# Patient Record
Sex: Female | Born: 1945 | Race: White | Hispanic: No | Marital: Married | Smoking: Never smoker
Health system: Southern US, Community
[De-identification: ages and names within clinical notes are randomized; demographics above are authoritative.]

## PROBLEM LIST (undated history)

## (undated) DIAGNOSIS — M1990 Unspecified osteoarthritis, unspecified site: Secondary | ICD-10-CM

## (undated) DIAGNOSIS — K219 Gastro-esophageal reflux disease without esophagitis: Secondary | ICD-10-CM

## (undated) HISTORY — PX: ABDOMINAL HYSTERECTOMY: SHX81

## (undated) HISTORY — PX: APPENDECTOMY: SHX54

## (undated) HISTORY — PX: TONSILLECTOMY: SUR1361

---

## 2008-01-19 ENCOUNTER — Ambulatory Visit: Payer: Self-pay | Admitting: Family Medicine

## 2009-05-14 ENCOUNTER — Ambulatory Visit: Payer: Self-pay | Admitting: Nurse Practitioner

## 2010-10-05 HISTORY — PX: OTHER SURGICAL HISTORY: SHX169

## 2010-11-14 ENCOUNTER — Ambulatory Visit: Payer: Self-pay | Admitting: Unknown Physician Specialty

## 2010-11-17 LAB — PATHOLOGY REPORT

## 2011-07-09 ENCOUNTER — Ambulatory Visit: Payer: Self-pay

## 2012-08-18 ENCOUNTER — Ambulatory Visit: Payer: Self-pay | Admitting: Nurse Practitioner

## 2020-01-13 ENCOUNTER — Ambulatory Visit: Payer: Medicare Other | Attending: Internal Medicine

## 2020-01-13 DIAGNOSIS — Z23 Encounter for immunization: Secondary | ICD-10-CM

## 2020-01-13 NOTE — Progress Notes (Signed)
   Covid-19 Vaccination Clinic  Name:  FUSAKO TANABE    MRN: [id number] DOB: 1946/04/24  01/13/2020  Ms. Schroepfer was observed post Covid-19 immunization for 15 minutes without incident. She was provided with Vaccine Information Sheet and instruction to access the V-Safe system.   Ms. Hartinger was instructed to call 911 with any severe reactions post vaccine: Marland Kitchen Difficulty breathing  . Swelling of face and throat  . A fast heartbeat  . A bad rash all over body  . Dizziness and weakness   Immunizations Administered    Name Date Dose VIS Date Route   Pfizer COVID-19 Vaccine 01/13/2020  9:16 AM 0.3 mL 09/15/2019 Intramuscular   Manufacturer: [employer], [employer]   Lot: [id number]   NDC: [address]-5

## 2020-02-06 ENCOUNTER — Ambulatory Visit: Payer: Medicare Other | Attending: Internal Medicine

## 2020-02-06 DIAGNOSIS — Z23 Encounter for immunization: Secondary | ICD-10-CM

## 2020-02-06 NOTE — Progress Notes (Signed)
   Covid-19 Vaccination Clinic  Name:  Miranda Daniel    MRN: [id number] DOB: March 23, 1946  02/06/2020  Miranda Daniel was observed post Covid-19 immunization for 15 minutes without incident. She was provided with Vaccine Information Sheet and instruction to access the V-Safe system.   Miranda Daniel was instructed to call 911 with any severe reactions post vaccine: Marland Kitchen Difficulty breathing  . Swelling of face and throat  . A fast heartbeat  . A bad rash all over body  . Dizziness and weakness   Immunizations Administered    Name Date Dose VIS Date Route   Pfizer COVID-19 Vaccine 02/06/2020 11:34 AM 0.3 mL 11/29/2018 Intramuscular   Manufacturer: [employer], [employer]   Lot: [id number]   NDC: [address]-0

## 2021-01-10 DIAGNOSIS — M1612 Unilateral primary osteoarthritis, left hip: Secondary | ICD-10-CM | POA: Insufficient documentation

## 2021-01-10 DIAGNOSIS — M1611 Unilateral primary osteoarthritis, right hip: Secondary | ICD-10-CM | POA: Insufficient documentation

## 2021-02-07 ENCOUNTER — Other Ambulatory Visit: Payer: Self-pay | Admitting: Family Medicine

## 2021-02-07 DIAGNOSIS — Z1231 Encounter for screening mammogram for malignant neoplasm of breast: Secondary | ICD-10-CM

## 2021-03-12 ENCOUNTER — Other Ambulatory Visit: Payer: Medicare Other

## 2021-03-19 ENCOUNTER — Other Ambulatory Visit: Payer: Medicare Other

## 2021-03-30 NOTE — Discharge Instructions (Signed)
Instructions after Total Hip Replacement     Patterson Hollenbaugh P. Raygan Skarda, [name], M.D.     Dept. of Orthopaedics & Sports Medicine  [facility]  [address]  [place], [place]  [address]  Phone: [phone]   Fax: [phone]    DIET: . Drink plenty of non-alcoholic fluids. . Resume your normal diet. Include foods high in fiber.  ACTIVITY:  . You may use crutches or a walker with weight-bearing as tolerated, unless instructed otherwise. . You may be weaned off of the walker or crutches by your Physical Therapist.  . Do NOT reach below the level of your knees or cross your legs until allowed.    . Continue doing gentle exercises. Exercising will reduce the pain and swelling, increase motion, and prevent muscle weakness.   . Please continue to use the TED compression stockings for 6 weeks. You may remove the stockings at night, but should reapply them in the morning. . Do not drive or operate any equipment until instructed.  WOUND CARE:  . Continue to use ice packs periodically to reduce pain and swelling. . Keep the incision clean and dry. . You may bathe or shower after the staples are removed at the first office visit following surgery.  MEDICATIONS: . You may resume your regular medications. . Please take the pain medication as prescribed on the medication. . Do not take pain medication on an empty stomach. . You have been given a prescription for a blood thinner to prevent blood clots. Please take the medication as instructed. (NOTE: After completing a 2 week course of Lovenox, take one Enteric-coated aspirin once a day.) . Pain medications and iron supplements can cause constipation. Use a stool softener (Senokot or Colace) on a daily basis and a laxative (dulcolax or miralax) as needed. . Do not drive or drink alcoholic beverages when taking pain medications.  CALL THE OFFICE FOR: . Temperature above 101 degrees . Excessive bleeding or drainage on the dressing. . Excessive  swelling, coldness, or paleness of the toes. . Persistent nausea and vomiting.  FOLLOW-UP:  . You should have an appointment to return to the office in 6 weeks after surgery. . Arrangements have been made for continuation of Physical Therapy (either home therapy or outpatient therapy).     [facility] Department Directory         [email or web address]       [email or web address]          Cardiology  Appointments: [place] - [phone] [place] - [phone]  Endocrinology  Appointments: [place] - [phone] [place] - [phone]  Gastroenterology  Appointments: [place] - [phone] [place] - [phone]        General Surgery   Appointments: [place] - [phone]  Internal Medicine/Family Medicine  Appointments: [place] - [phone] [place] - [phone] [place] - [phone]  Metabolic and Weigh Loss Surgery  Appointments: [place] - [phone]        Neurology  Appointments: [place] - [phone] [place] - [phone]  Neurosurgery  Appointments: [place] - [phone]  Obstetrics & Gynecology  Appointments: [place] - [phone] [place] - [phone]        Pediatrics  Appointments: [place] - [phone] [place] - [phone]  Physiatry  Appointments: [place] -[phone]  Physical Therapy  Appointments: [place] - [phone] [place] - [phone]        Podiatry  Appointments: [place] - [phone] [place] - [phone]  Pulmonology  Appointments: [place] - [phone]  Rheumatology  Appointments:  - [phone]         Location: [facility]  [address] [place], [place]  [address]  [place] Location: [facility] [address] [place], [place]  [address]  [place] Location: [facility] [address] [place], [place]  [address]    

## 2021-04-14 ENCOUNTER — Encounter
Admission: RE | Admit: 2021-04-14 | Discharge: 2021-04-14 | Disposition: A | Discharge status: Home / Self Care | Payer: Medicare Other | Source: Ambulatory Visit | Attending: Orthopedic Surgery | Admitting: Orthopedic Surgery

## 2021-04-14 ENCOUNTER — Other Ambulatory Visit: Payer: Self-pay

## 2021-04-14 DIAGNOSIS — Z01812 Encounter for preprocedural laboratory examination: Secondary | ICD-10-CM | POA: Insufficient documentation

## 2021-04-14 HISTORY — DX: Gastro-esophageal reflux disease without esophagitis: K21.9

## 2021-04-14 LAB — URINALYSIS, ROUTINE W REFLEX MICROSCOPIC
Bilirubin Urine: NEGATIVE
Glucose, UA: NEGATIVE mg/dL
Ketones, ur: NEGATIVE mg/dL
Leukocytes,Ua: NEGATIVE
Nitrite: NEGATIVE
Protein, ur: NEGATIVE mg/dL
Specific Gravity, Urine: 1.008 (ref 1.005–1.030)
pH: 5 (ref 5.0–8.0)

## 2021-04-14 LAB — TYPE AND SCREEN
ABO/RH(D): O POS
Antibody Screen: NEGATIVE

## 2021-04-14 LAB — PROTIME-INR
INR: 0.9 (ref 0.8–1.2)
Prothrombin Time: 12.4 seconds (ref 11.4–15.2)

## 2021-04-14 LAB — SEDIMENTATION RATE: Sed Rate: 6 mm/hr (ref 0–30)

## 2021-04-14 LAB — C-REACTIVE PROTEIN: CRP: <0.5 mg/dL (ref <1.0)

## 2021-04-14 LAB — APTT: aPTT: 27 seconds (ref 24–36)

## 2021-04-14 LAB — SURGICAL PCR SCREEN
MRSA, PCR: NEGATIVE
Staphylococcus aureus: POSITIVE — AB

## 2021-04-14 NOTE — Patient Instructions (Signed)
Your procedure is scheduled on: Friday April 25, 2021. Report to Day Surgery inside [facility] 2nd floor (stop by admissions desk first before getting on elevator). To find out your arrival time please call [phone] between 1PM - 3PM on Thursday April 24, 2021.  Remember: Instructions that are not followed completely may result in serious medical risk,  up to and including death, or upon the discretion of your surgeon and anesthesiologist your  surgery may need to be rescheduled.     _X__ 1. Do not eat food after midnight the night before your procedure.                 No chewing gum or hard candies. You may drink clear liquids up to 2 hours                 before you are scheduled to arrive for your surgery- DO not drink clear                 liquids within 2 hours of the start of your surgery.                 Clear Liquids include:  water, apple juice without pulp, clear Gatorade, G2 or                  Gatorade Zero (avoid Red/Purple/Blue), Black Coffee or Tea (Do not add                 anything to coffee or tea).  __X__2.   Complete the "Ensure Clear Pre-surgery Clear Carbohydrate Drink" provided to you, 2 hours before arrival. **If you are diabetic you will be provided with an alternative drink, Gatorade Zero or G2.  __X__3.  On the morning of surgery brush your teeth with toothpaste and water, you                may rinse your mouth with mouthwash if you wish.  Do not swallow any toothpaste of mouthwash.     _X__ 4.  No Alcohol for 24 hours before or after surgery.   _X__ 5.  Do Not Smoke or use e-cigarettes For 24 Hours Prior to Your Surgery.                 Do not use any chewable tobacco products for at least 6 hours prior to                 Surgery.  _X__  6.  Do not use any recreational drugs (marijuana, cocaine, heroin, ecstasy, MDMA or other)                For at least one week prior to your surgery.  Combination of these drugs with  anesthesia                May have life threatening results.  __X__  7.  Notify your doctor if there is any change in your medical condition      (cold, fever, infections).     Do not wear jewelry, make-up, hairpins, clips or nail polish. Do not wear lotions, powders, or perfumes. You may wear deodorant. Do not shave 48 hours prior to surgery. Men may shave face and neck. Do not bring valuables to the hospital.    [facility] is not responsible for any belongings or valuables.  Contacts, dentures or bridgework may not be worn into surgery. Leave your suitcase in the car. After  surgery it may be brought to your room. For patients admitted to the hospital, discharge time is determined by your treatment team.   Patients discharged the day of surgery will not be allowed to drive home.   Make arrangements for someone to be with you for the first 24 hours of your Same Day Discharge.    Please read over the following fact sheets that you were given:   Total Joint Packet    __X__ Take these medicines the morning of surgery with A SIP OF WATER:    1. esomeprazole (NEXIUM) 20 MG   2.   3.   4.  5.  6.  ____ Fleet Enema (as directed)   __X__ Use CHG Soap (or wipes) as directed  ____ Use Benzoyl Peroxide Gel as instructed  ____ Use inhalers on the day of surgery  ____ Stop metformin 2 days prior to surgery    ____ Take 1/2 of usual insulin dose the night before surgery. No insulin the morning          of surgery.   ____ Call your PCP, cardiologist, or Pulmonologist if taking Coumadin/Plavix/aspirin and ask when to stop before your surgery.   __X__ One Week prior to surgery- Stop Anti-inflammatories such as Ibuprofen, Aleve, Advil, Motrin, meloxicam (MOBIC), diclofenac, etodolac, ketorolac, Toradol, Daypro, piroxicam, Goody's or BC powders. OK TO USE TYLENOL IF NEEDED   __X__ Stop any supplements until after surgery.    ____ Bring C-Pap to the hospital.    If you have any  questions regarding your pre-procedure instructions,  Please call Pre-admit Testing at [phone].

## 2021-04-15 LAB — URINE CULTURE
Culture: NO GROWTH
Special Requests: NORMAL

## 2021-04-20 NOTE — H&P (Signed)
ORTHOPAEDIC HISTORY & PHYSICAL Michelene Gardener, [place] - 04/17/2021 2:45 PM EDT Formatting of this note is different from the original. [facility] Chief Complaint:   Chief Complaint  Patient presents with   Hip Pain  H & P RIGHT HIP   History of Present Illness:   Miranda Daniel is a 75 y.o. female that presents to clinic today for her preoperative history and evaluation. Patient presents with her husband. The patient is scheduled to undergo a right total hip arthroplasty on 04/25/21 by Dr. Ernest Pine. Her pain began approximately 1 year ago. The pain is located in the right hip and groin. She describes her pain as worse with weightbearing. She reports associated decrease in hip range of motion and difficulty with putting on and removing shoes and socks. She denies associated numbness or tingling.   The patient's symptoms have progressed to the point that they decrease her quality of life. The patient has previously undergone conservative treatment including NSAIDS, Tylenol and activity modification without adequate control of her symptoms.  Patient denies any history of blood clots, lumbar surgery, denies significant cardiac history.  Past Medical, Surgical, Family, Social History, Allergies, Medications:   Past Medical History:  Past Medical History:  Diagnosis Date   Chicken pox   GERD (gastroesophageal reflux disease)   Osteoarthritis   Past Surgical History:  Past Surgical History:  Procedure Laterality Date   APPENDECTOMY   COLONOSCOPY 11/14/2010  Hyperplastic Polyp: CBF 11/2020   HYSTERECTOMY   TONSILLECTOMY   Current Medications:  Current Outpatient Medications  Medication Sig Dispense Refill   acetaminophen (TYLENOL) 500 MG tablet Take 1,000 mg by mouth every 6 (six) hours as needed   esomeprazole (NEXIUM) 20 MG DR capsule Take 20 mg by mouth once daily   ibuprofen (MOTRIN) 200 MG tablet Take 400 mg by mouth once daily as  needed for Pain Alternates with aleve every other day   naproxen sodium (ALEVE) 220 MG tablet Take 220 mg by mouth once daily as needed for Pain Alternates with ibuprofen every other day   No current facility-administered medications for this visit.   Allergies:  Allergies  Allergen Reactions   Amoxicillin-Pot Clavulanate Diarrhea and Other (See Comments)  Upset stomach   Social History:  Social History   Socioeconomic History   Marital status: Married  Spouse name: Ronnie   Number of children: 2   Years of education: 12   Highest education level: High school graduate  Occupational History   Occupation: Retired- IT consultant  Tobacco Use   Smoking status: Never Smoker   Smokeless tobacco: Never Used  Building services engineer Use: Never used  Substance and Sexual Activity   Alcohol use: Yes  Alcohol/week: 4.0 standard drinks  Types: 4 Glasses of wine per week   Drug use: No   Sexual activity: Yes  Partners: Male   Family History:  Family History  Problem Relation Age of Onset   Dementia Mother   No Known Problems Father   Melanoma Sister   Dementia Maternal Grandmother   Dementia Maternal Grandfather   Review of Systems:   A 10+ ROS was performed, reviewed, and the pertinent orthopaedic findings are documented in the HPI.   Physical Examination:   BP 126/82 (BP Location: Left upper arm, Patient Position: Sitting, BP Cuff Size: Adult)  Ht 170.2 cm (5\' 7" )  Wt 86.1 kg (189 lb 12.8 oz)  BMI 29.73 kg/m   Patient is a well-developed, well-nourished  female in no acute distress. Patient has normal mood and affect. Patient is alert and oriented to person, place, and time.   HEENT: Atraumatic, normocephalic. Pupils equal and reactive to light. Extraocular motion intact. Noninjected sclera.  Cardiovascular: Regular rate and rhythm, with no murmurs, rubs, or gallops. Distal pulses ausculated with Doppler.  Respiratory: Lungs clear to auscultation bilaterally.   Patient  ambulating with use of a cane.  Right Hip: Pelvic tilt: Negative Limb lengths: Equal with the patient standing Soft tissue swelling: Negative Erythema: Negative Crepitance: Negative Tenderness: Greater trochanter is mildly tender to palpation. Moderate pain is elicited by axial compression or extremes of rotation. Atrophy: No atrophy. Fair to good hip flexor and abductor strength. Range of Motion: EXT/FLEX: 0/0/90 ADD/ABD: 20/0/20 IR/ER: 5/0/15   Sensation is intact over the saphenous, lateral cutaneous, superficial fibular, and deep fibular nerve distributions.  Tests Performed/Reviewed:  X-rays  Anteroposterior views of the pelvis as well as anteroposterior and lateral views of the right hip were obtained. Images reveal complete loss of femoral acetabular joint space with bone-on-bone contact and early deformation of the femoral head noted. Significant osteophyte formation noted of both acetabulum and femoral head.  I personally ordered and interpreted today's x-rays  Impression:   ICD-10-CM  1. Primary osteoarthritis of right hip M16.11   Plan:   The patient has end-stage degenerative changes of the right hip. It was explained to the patient that the condition is progressive in nature. Having failed conservative treatment, the patient has elected to proceed with a total joint arthroplasty. The patient will undergo a total joint arthroplasty with Dr. Ernest Pine. The risks of surgery, including blood clot and infection, were discussed with the patient. Measures to reduce these risks, including the use of anticoagulation, perioperative antibiotics, and early ambulation were discussed. The importance of postoperative physical therapy was discussed with the patient. The patient elects to proceed with surgery. The patient is instructed to stop all blood thinners prior to surgery. The patient is instructed to call the hospital the day before surgery to learn of the proper arrival time.    Contact our office with any questions or concerns. Follow up as indicated, or sooner should any new problems arise, if conditions worsen, or if they are otherwise concerned.   Michelene Gardener, PA-C [facility] Orthopaedics and Sports Medicine [address] [place], [place] [address] Phone: [phone]  This note was generated in part with voice recognition software and I apologize for any typographical errors that were not detected and corrected.  Electronically signed by Michelene Gardener, PA at 04/19/2021 1:47 PM EDT

## 2021-04-23 ENCOUNTER — Other Ambulatory Visit: Payer: Self-pay

## 2021-04-23 ENCOUNTER — Other Ambulatory Visit
Admission: RE | Admit: 2021-04-23 | Discharge: 2021-04-23 | Disposition: A | Discharge status: Home / Self Care | Payer: Medicare Other | Source: Ambulatory Visit | Attending: Orthopedic Surgery | Admitting: Orthopedic Surgery

## 2021-04-23 DIAGNOSIS — Z01812 Encounter for preprocedural laboratory examination: Secondary | ICD-10-CM | POA: Insufficient documentation

## 2021-04-23 DIAGNOSIS — Z20822 Contact with and (suspected) exposure to covid-19: Secondary | ICD-10-CM | POA: Insufficient documentation

## 2021-04-23 LAB — SARS CORONAVIRUS 2 (TAT 6-24 HRS): SARS Coronavirus 2: NEGATIVE

## 2021-04-25 ENCOUNTER — Inpatient Hospital Stay: Payer: Medicare Other | Admitting: Certified Registered"

## 2021-04-25 ENCOUNTER — Other Ambulatory Visit: Payer: Self-pay

## 2021-04-25 ENCOUNTER — Encounter: Payer: Self-pay | Admitting: Orthopedic Surgery

## 2021-04-25 ENCOUNTER — Inpatient Hospital Stay
Admission: RE | Admit: 2021-04-25 | Discharge: 2021-04-28 | DRG: 470 | Disposition: A | Discharge status: Home Health | Payer: Medicare Other | Attending: Orthopedic Surgery | Admitting: Orthopedic Surgery

## 2021-04-25 ENCOUNTER — Inpatient Hospital Stay: Payer: Medicare Other

## 2021-04-25 ENCOUNTER — Encounter
Admission: RE | Disposition: A | Discharge status: Home Health | Payer: Self-pay | Source: Home / Self Care | Attending: Orthopedic Surgery

## 2021-04-25 DIAGNOSIS — Z880 Allergy status to penicillin: Secondary | ICD-10-CM

## 2021-04-25 DIAGNOSIS — K219 Gastro-esophageal reflux disease without esophagitis: Secondary | ICD-10-CM | POA: Diagnosis present

## 2021-04-25 DIAGNOSIS — M1611 Unilateral primary osteoarthritis, right hip: Principal | ICD-10-CM | POA: Diagnosis present

## 2021-04-25 DIAGNOSIS — Z96641 Presence of right artificial hip joint: Secondary | ICD-10-CM

## 2021-04-25 DIAGNOSIS — Z96649 Presence of unspecified artificial hip joint: Secondary | ICD-10-CM

## 2021-04-25 HISTORY — PX: TOTAL HIP ARTHROPLASTY: SHX124

## 2021-04-25 LAB — ABO/RH: ABO/RH(D): O POS

## 2021-04-25 SURGERY — ARTHROPLASTY, HIP, TOTAL,POSTERIOR APPROACH
Anesthesia: Spinal | Site: Hip | Laterality: Right

## 2021-04-25 MED ORDER — SURGIPHOR WOUND IRRIGATION SYSTEM - OPTIME: TOPICAL | Status: DC | PRN

## 2021-04-25 MED ORDER — DEXAMETHASONE SODIUM PHOSPHATE 10 MG/ML IJ SOLN: 8.0000 mg | Freq: Once | INTRAMUSCULAR | Status: AC

## 2021-04-25 MED ORDER — TRANEXAMIC ACID-NACL 1000-0.7 MG/100ML-% IV SOLN
1000.0000 mg | Freq: Once | INTRAVENOUS | Status: AC
  Administered 2021-04-25: 1000 mg via INTRAVENOUS

## 2021-04-25 MED ORDER — MENTHOL 3 MG MT LOZG
1.0000 | LOZENGE | OROMUCOSAL | Status: DC | PRN
  Filled 2021-04-25: qty 9

## 2021-04-25 MED ORDER — CHLORHEXIDINE GLUCONATE 4 % EX LIQD
60.0000 mL | Freq: Once | CUTANEOUS | Status: AC
  Administered 2021-04-25: 4 via TOPICAL

## 2021-04-25 MED ORDER — OXYCODONE HCL 5 MG PO TABS
5.0000 mg | ORAL_TABLET | ORAL | Status: DC | PRN
  Administered 2021-04-25 – 2021-04-28 (×8): 5 mg via ORAL
  Filled 2021-04-25 (×5): qty 1

## 2021-04-25 MED ORDER — CELECOXIB 200 MG PO CAPS: 400.0000 mg | ORAL_CAPSULE | Freq: Once | ORAL | Status: AC

## 2021-04-25 MED ORDER — LACTATED RINGERS IV SOLN: INTRAVENOUS | Status: DC

## 2021-04-25 MED ORDER — FENTANYL CITRATE (PF) 100 MCG/2ML IJ SOLN
INTRAMUSCULAR | Status: AC
  Filled 2021-04-25: qty 2

## 2021-04-25 MED ORDER — CELECOXIB 200 MG PO CAPS
200.0000 mg | ORAL_CAPSULE | Freq: Two times a day (BID) | ORAL | Status: DC
  Administered 2021-04-25 – 2021-04-28 (×6): 200 mg via ORAL
  Filled 2021-04-25 (×6): qty 1

## 2021-04-25 MED ORDER — PANTOPRAZOLE SODIUM 40 MG PO TBEC
40.0000 mg | DELAYED_RELEASE_TABLET | Freq: Two times a day (BID) | ORAL | Status: DC
  Administered 2021-04-25 – 2021-04-28 (×7): 40 mg via ORAL
  Filled 2021-04-25 (×7): qty 1

## 2021-04-25 MED ORDER — HYDROMORPHONE HCL 1 MG/ML IJ SOLN
0.5000 mg | INTRAMUSCULAR | Status: DC | PRN
  Administered 2021-04-25 – 2021-04-26 (×2): 1 mg via INTRAVENOUS
  Filled 2021-04-25 (×2): qty 1

## 2021-04-25 MED ORDER — MIDAZOLAM HCL 5 MG/5ML IJ SOLN
INTRAMUSCULAR | Status: DC | PRN
  Administered 2021-04-25: 1 mg via INTRAVENOUS

## 2021-04-25 MED ORDER — ACETAMINOPHEN 10 MG/ML IV SOLN
INTRAVENOUS | Status: DC | PRN
  Administered 2021-04-25: 1000 mg via INTRAVENOUS

## 2021-04-25 MED ORDER — CELECOXIB 200 MG PO CAPS
ORAL_CAPSULE | ORAL | Status: AC
  Administered 2021-04-25: 400 mg via ORAL
  Filled 2021-04-25: qty 2

## 2021-04-25 MED ORDER — SENNOSIDES-DOCUSATE SODIUM 8.6-50 MG PO TABS
1.0000 | ORAL_TABLET | Freq: Two times a day (BID) | ORAL | Status: DC
  Administered 2021-04-25 – 2021-04-28 (×6): 1 via ORAL
  Filled 2021-04-25 (×6): qty 1

## 2021-04-25 MED ORDER — ACETAMINOPHEN 10 MG/ML IV SOLN
INTRAVENOUS | Status: AC
  Filled 2021-04-25: qty 100

## 2021-04-25 MED ORDER — FERROUS SULFATE 325 (65 FE) MG PO TABS
325.0000 mg | ORAL_TABLET | Freq: Two times a day (BID) | ORAL | Status: DC
  Administered 2021-04-25 – 2021-04-28 (×6): 325 mg via ORAL
  Filled 2021-04-25 (×6): qty 1

## 2021-04-25 MED ORDER — PROPOFOL 500 MG/50ML IV EMUL
INTRAVENOUS | Status: DC | PRN
  Administered 2021-04-25: 100 ug/kg/min via INTRAVENOUS

## 2021-04-25 MED ORDER — ACETAMINOPHEN 10 MG/ML IV SOLN
1000.0000 mg | Freq: Four times a day (QID) | INTRAVENOUS | Status: AC
  Administered 2021-04-25 – 2021-04-26 (×4): 1000 mg via INTRAVENOUS
  Filled 2021-04-25 (×4): qty 100

## 2021-04-25 MED ORDER — NEOMYCIN-POLYMYXIN B GU 40-200000 IR SOLN
Status: DC | PRN
  Administered 2021-04-25: 14 mL

## 2021-04-25 MED ORDER — 0.9 % SODIUM CHLORIDE (POUR BTL) OPTIME
TOPICAL | Status: DC | PRN
  Administered 2021-04-25: 500 mL

## 2021-04-25 MED ORDER — PHENYLEPHRINE HCL (PRESSORS) 10 MG/ML IV SOLN
INTRAVENOUS | Status: DC | PRN
  Administered 2021-04-25 (×4): 100 ug via INTRAVENOUS

## 2021-04-25 MED ORDER — ALUM & MAG HYDROXIDE-SIMETH 200-200-20 MG/5ML PO SUSP: 30.0000 mL | ORAL | Status: DC | PRN

## 2021-04-25 MED ORDER — CEFAZOLIN SODIUM-DEXTROSE 2-4 GM/100ML-% IV SOLN
2.0000 g | INTRAVENOUS | Status: AC
  Administered 2021-04-25: 2 g via INTRAVENOUS

## 2021-04-25 MED ORDER — BISACODYL 10 MG RE SUPP: 10.0000 mg | Freq: Every day | RECTAL | Status: DC | PRN

## 2021-04-25 MED ORDER — TRANEXAMIC ACID-NACL 1000-0.7 MG/100ML-% IV SOLN
INTRAVENOUS | Status: AC
  Filled 2021-04-25: qty 100

## 2021-04-25 MED ORDER — TRAMADOL HCL 50 MG PO TABS: 50.0000 mg | ORAL_TABLET | ORAL | Status: DC | PRN

## 2021-04-25 MED ORDER — PHENOL 1.4 % MT LIQD
1.0000 | OROMUCOSAL | Status: DC | PRN
  Filled 2021-04-25: qty 177

## 2021-04-25 MED ORDER — ENOXAPARIN SODIUM 30 MG/0.3ML IJ SOSY
30.0000 mg | PREFILLED_SYRINGE | Freq: Two times a day (BID) | INTRAMUSCULAR | Status: DC
  Administered 2021-04-26 – 2021-04-28 (×5): 30 mg via SUBCUTANEOUS
  Filled 2021-04-25 (×5): qty 0.3

## 2021-04-25 MED ORDER — DIPHENHYDRAMINE HCL 12.5 MG/5ML PO ELIX: 12.5000 mg | ORAL_SOLUTION | ORAL | Status: DC | PRN

## 2021-04-25 MED ORDER — PROPOFOL 1000 MG/100ML IV EMUL
INTRAVENOUS | Status: AC
  Filled 2021-04-25: qty 100

## 2021-04-25 MED ORDER — GABAPENTIN 300 MG PO CAPS: 300.0000 mg | ORAL_CAPSULE | Freq: Once | ORAL | Status: AC

## 2021-04-25 MED ORDER — CHLORHEXIDINE GLUCONATE 0.12 % MT SOLN: 15.0000 mL | Freq: Once | OROMUCOSAL | Status: AC

## 2021-04-25 MED ORDER — ONDANSETRON HCL 4 MG PO TABS: 4.0000 mg | ORAL_TABLET | Freq: Four times a day (QID) | ORAL | Status: DC | PRN

## 2021-04-25 MED ORDER — NEOMYCIN-POLYMYXIN B GU 40-200000 IR SOLN
Status: AC
  Filled 2021-04-25: qty 20

## 2021-04-25 MED ORDER — ORAL CARE MOUTH RINSE: 15.0000 mL | Freq: Once | OROMUCOSAL | Status: AC

## 2021-04-25 MED ORDER — ONDANSETRON HCL 4 MG/2ML IJ SOLN: 4.0000 mg | Freq: Once | INTRAMUSCULAR | Status: DC | PRN

## 2021-04-25 MED ORDER — MAGNESIUM HYDROXIDE 400 MG/5ML PO SUSP
30.0000 mL | Freq: Every day | ORAL | Status: DC
  Administered 2021-04-25 – 2021-04-26 (×2): 30 mL via ORAL
  Filled 2021-04-25 (×2): qty 30

## 2021-04-25 MED ORDER — ACETAMINOPHEN 325 MG PO TABS
325.0000 mg | ORAL_TABLET | Freq: Four times a day (QID) | ORAL | Status: DC | PRN
  Administered 2021-04-27 – 2021-04-28 (×2): 650 mg via ORAL
  Filled 2021-04-25 (×2): qty 2

## 2021-04-25 MED ORDER — OXYCODONE HCL 5 MG PO TABS
10.0000 mg | ORAL_TABLET | ORAL | Status: DC | PRN
  Administered 2021-04-25: 10 mg via ORAL
  Filled 2021-04-25 (×3): qty 2

## 2021-04-25 MED ORDER — OXYCODONE HCL 5 MG PO TABS
ORAL_TABLET | ORAL | Status: AC
  Filled 2021-04-25: qty 1

## 2021-04-25 MED ORDER — ONDANSETRON HCL 4 MG/2ML IJ SOLN: 4.0000 mg | Freq: Four times a day (QID) | INTRAMUSCULAR | Status: DC | PRN

## 2021-04-25 MED ORDER — DEXAMETHASONE SODIUM PHOSPHATE 10 MG/ML IJ SOLN
INTRAMUSCULAR | Status: AC
  Administered 2021-04-25: 8 mg via INTRAVENOUS
  Filled 2021-04-25: qty 1

## 2021-04-25 MED ORDER — TRANEXAMIC ACID-NACL 1000-0.7 MG/100ML-% IV SOLN
1000.0000 mg | INTRAVENOUS | Status: AC
Start: 2021-04-25 — End: 2021-04-25
  Administered 2021-04-25: 1000 mg via INTRAVENOUS

## 2021-04-25 MED ORDER — SODIUM CHLORIDE 0.9 % IV SOLN
INTRAVENOUS | Status: DC | PRN
  Administered 2021-04-25: 45 ug/min via INTRAVENOUS

## 2021-04-25 MED ORDER — METOCLOPRAMIDE HCL 10 MG PO TABS
10.0000 mg | ORAL_TABLET | Freq: Three times a day (TID) | ORAL | Status: AC
  Administered 2021-04-25 – 2021-04-27 (×8): 10 mg via ORAL
  Filled 2021-04-25 (×8): qty 1

## 2021-04-25 MED ORDER — CHLORHEXIDINE GLUCONATE 0.12 % MT SOLN
OROMUCOSAL | Status: AC
  Administered 2021-04-25: 15 mL via OROMUCOSAL
  Filled 2021-04-25: qty 15

## 2021-04-25 MED ORDER — ENSURE PRE-SURGERY PO LIQD
296.0000 mL | Freq: Once | ORAL | Status: AC
  Administered 2021-04-25: 296 mL via ORAL
  Filled 2021-04-25: qty 296

## 2021-04-25 MED ORDER — CEFAZOLIN SODIUM-DEXTROSE 2-4 GM/100ML-% IV SOLN
INTRAVENOUS | Status: AC
  Filled 2021-04-25: qty 100

## 2021-04-25 MED ORDER — MIDAZOLAM HCL 2 MG/2ML IJ SOLN
INTRAMUSCULAR | Status: AC
  Filled 2021-04-25: qty 2

## 2021-04-25 MED ORDER — CEFAZOLIN SODIUM-DEXTROSE 2-4 GM/100ML-% IV SOLN
2.0000 g | Freq: Four times a day (QID) | INTRAVENOUS | Status: AC
  Administered 2021-04-25 (×2): 2 g via INTRAVENOUS
  Filled 2021-04-25 (×2): qty 100

## 2021-04-25 MED ORDER — FLEET ENEMA 7-19 GM/118ML RE ENEM
1.0000 | ENEMA | Freq: Once | RECTAL | Status: DC | PRN
Start: 2021-04-25 — End: 2021-04-28

## 2021-04-25 MED ORDER — FENTANYL CITRATE (PF) 100 MCG/2ML IJ SOLN
25.0000 ug | INTRAMUSCULAR | Status: DC | PRN
  Administered 2021-04-25 (×2): 50 ug via INTRAVENOUS
  Administered 2021-04-25 (×2): 25 ug via INTRAVENOUS

## 2021-04-25 MED ORDER — SODIUM CHLORIDE 0.9 % IV SOLN: INTRAVENOUS | Status: DC

## 2021-04-25 MED ORDER — GABAPENTIN 300 MG PO CAPS
ORAL_CAPSULE | ORAL | Status: AC
  Administered 2021-04-25: 300 mg via ORAL
  Filled 2021-04-25: qty 1

## 2021-04-25 MED ORDER — PHENYLEPHRINE HCL (PRESSORS) 10 MG/ML IV SOLN
INTRAVENOUS | Status: AC
  Filled 2021-04-25: qty 1

## 2021-04-25 MED ORDER — SODIUM CHLORIDE 0.9 % IR SOLN
Status: DC | PRN
  Administered 2021-04-25: 3000 mL

## 2021-04-25 SURGICAL SUPPLY — 64 items
BLADE DRUM FLTD (BLADE) ×2 IMPLANT
BLADE SAW 90X25X1.19 OSCILLAT (BLADE) ×2 IMPLANT
CANISTER SUCT 1200ML W/VALVE (MISCELLANEOUS) ×1 IMPLANT
CARTRIDGE OIL MAESTRO DRILL (MISCELLANEOUS) ×1 IMPLANT
CUP ACETBLR 54 OD 100 SERIES (Hips) ×1 IMPLANT
DIFFUSER DRILL AIR PNEUMATIC (MISCELLANEOUS) ×2 IMPLANT
DRAPE 3/4 80X56 (DRAPES) ×2 IMPLANT
DRAPE IMP U-DRAPE 54X76 (DRAPES) ×1 IMPLANT
DRAPE INCISE IOBAN 66X60 STRL (DRAPES) ×2 IMPLANT
DRSG DERMACEA 8X12 NADH (GAUZE/BANDAGES/DRESSINGS) ×2 IMPLANT
DRSG MEPILEX SACRM 8.7X9.8 (GAUZE/BANDAGES/DRESSINGS) ×2 IMPLANT
DRSG OPSITE POSTOP 4X12 (GAUZE/BANDAGES/DRESSINGS) ×1 IMPLANT
DRSG OPSITE POSTOP 4X14 (GAUZE/BANDAGES/DRESSINGS) ×1 IMPLANT
DRSG TEGADERM 4X4.75 (GAUZE/BANDAGES/DRESSINGS) ×2 IMPLANT
DURAPREP 26ML APPLICATOR (WOUND CARE) ×3 IMPLANT
ELECT CAUTERY BLADE 6.4 (BLADE) ×2 IMPLANT
ELECT REM PT RETURN 9FT ADLT (ELECTROSURGICAL) ×2
ELECTRODE REM PT RTRN 9FT ADLT (ELECTROSURGICAL) ×1 IMPLANT
GAUZE 4X4 16PLY ~~LOC~~+RFID DBL (SPONGE) ×1 IMPLANT
GLOVE SURG ENC MOIS LTX SZ7.5 (GLOVE) ×4 IMPLANT
GLOVE SURG ENC TEXT LTX SZ7.5 (GLOVE) ×4 IMPLANT
GLOVE SURG UNDER LTX SZ8 (GLOVE) ×2 IMPLANT
GLOVE SURG UNDER POLY LF SZ7.5 (GLOVE) ×2 IMPLANT
GOWN STRL REUS W/ TWL LRG LVL3 (GOWN DISPOSABLE) ×2 IMPLANT
GOWN STRL REUS W/ TWL XL LVL3 (GOWN DISPOSABLE) ×1 IMPLANT
GOWN STRL REUS W/TWL LRG LVL3 (GOWN DISPOSABLE) ×2
GOWN STRL REUS W/TWL XL LVL3 (GOWN DISPOSABLE) ×1
HEAD M SROM 36MM PLUS 1.5 (Hips) IMPLANT
HEMOVAC 400CC 10FR (MISCELLANEOUS) ×2 IMPLANT
HOLDER FOLEY CATH W/STRAP (MISCELLANEOUS) ×2 IMPLANT
HOOD PEEL AWAY FLYTE STAYCOOL (MISCELLANEOUS) ×4 IMPLANT
IRRIGATION SURGIPHOR STRL (IV SOLUTION) ×2 IMPLANT
IV NS IRRIG 3000ML ARTHROMATIC (IV SOLUTION) ×2 IMPLANT
KIT PEG BOARD PINK (KITS) ×2 IMPLANT
KIT TURNOVER KIT A (KITS) ×2 IMPLANT
LINER NEUTRAL 36ID 54OD (Liner) ×1 IMPLANT
MANIFOLD NEPTUNE II (INSTRUMENTS) ×4 IMPLANT
NDL SAFETY ECLIPSE 18X1.5 (NEEDLE) ×1 IMPLANT
NEEDLE HYPO 18GX1.5 SHARP (NEEDLE) ×1
NS IRRIG 500ML POUR BTL (IV SOLUTION) ×2 IMPLANT
OIL CARTRIDGE MAESTRO DRILL (MISCELLANEOUS) ×2
PACK HIP PROSTHESIS (MISCELLANEOUS) ×2 IMPLANT
PENCIL SMOKE EVACUATOR COATED (MISCELLANEOUS) ×2 IMPLANT
PIN STEIN THRED 5/32 (Pin) ×1 IMPLANT
PULSAVAC PLUS IRRIG FAN TIP (DISPOSABLE) ×2
SOL PREP PVP 2OZ (MISCELLANEOUS) ×2
SOLUTION PREP PVP 2OZ (MISCELLANEOUS) ×1 IMPLANT
SPONGE DRAIN TRACH 4X4 STRL 2S (GAUZE/BANDAGES/DRESSINGS) ×2 IMPLANT
SPONGE T-LAP 18X18 ~~LOC~~+RFID (SPONGE) ×8 IMPLANT
SROM M HEAD 36MM PLUS 1.5 (Hips) ×2 IMPLANT
STAPLER SKIN PROX 35W (STAPLE) ×2 IMPLANT
STEM FEM CMNTLSS LG AML 15.0 (Hips) ×1 IMPLANT
SUT ETHIBOND #5 BRAIDED 30INL (SUTURE) ×2 IMPLANT
SUT VIC AB 0 CT1 36 (SUTURE) ×2 IMPLANT
SUT VIC AB 1 CT1 36 (SUTURE) ×4 IMPLANT
SUT VIC AB 2-0 CT1 27 (SUTURE) ×1
SUT VIC AB 2-0 CT1 TAPERPNT 27 (SUTURE) ×1 IMPLANT
SYR 20ML LL LF (SYRINGE) ×2 IMPLANT
TAPE CLOTH 3X10 WHT NS LF (GAUZE/BANDAGES/DRESSINGS) ×2 IMPLANT
TAPE TRANSPORE STRL 2 31045 (GAUZE/BANDAGES/DRESSINGS) ×2 IMPLANT
TIP FAN IRRIG PULSAVAC PLUS (DISPOSABLE) ×1 IMPLANT
TOWEL OR 17X26 4PK STRL BLUE (TOWEL DISPOSABLE) ×2 IMPLANT
TRAY FOLEY MTR SLVR 16FR STAT (SET/KITS/TRAYS/PACK) ×2 IMPLANT
TUBING CONNECTING 10 (TUBING) ×1 IMPLANT

## 2021-04-25 NOTE — Progress Notes (Signed)
Pt transferred to unit in stable condition from PACU.  Report received.  Pt A/O.

## 2021-04-25 NOTE — Evaluation (Signed)
Physical Therapy Evaluation Patient Details Name: Miranda Daniel MRN: [id number] DOB: 09-Jul-1946 Today's Date: 04/25/2021   History of Present Illness  75 y.o. female s/p R THA 04/25/21. PMH significant for GERD and OA.  Clinical Impression  Pt received supine in bed, drinking tea; husband and daughter in room. Pt agreeable to PT. BP was checked prior to mobilization due to previously low recording - BP 121/61. Pt was educated on WB precautions and mobility precautions; pt recited precautions back to PT prior to mobilizing. Dizziness was reported upon sitting upright however symptoms cleared within 1 minute and were not reported again. Bed mobility was performed with MIN-MOD A; STS, transfers, standing balance and ambulation were performed with CGA for occasional stabilizing and for safety. Pt voided during session, RN was notified. Pt does have 2 steps to navigate into home with no railing - pt will need to practice with RW prior to d/c. Pt would benefit from skilled PT to address above deficits and promote optimal return to PLOF.     Follow Up Recommendations Home health PT    Equipment Recommendations  None recommended by PT    Recommendations for Other Services       Precautions / Restrictions Precautions Precautions: Fall;Posterior Hip Precaution Booklet Issued: Yes (comment) Precaution Comments: reviewed precautions Restrictions Weight Bearing Restrictions: Yes RLE Weight Bearing: Weight bearing as tolerated      Mobility  Bed Mobility Overal bed mobility: Needs Assistance Bed Mobility: Supine to Sit;Sit to Supine     Supine to sit: Min assist Sit to supine: Min assist   General bed mobility comments: Very attentive family (husband and daughter) providing assist at trunk and HHA for pt to pull on. MIN A by PT to manage RLE when returning to bed.    Transfers Overall transfer level: Needs assistance Equipment used: Rolling walker (2 wheeled) Transfers: Sit to/from  Stand Sit to Stand: Min guard         General transfer comment: CGA to steady and for safety as this was pt first time out of bed after surgery. VC on technique to maintain precautions  Ambulation/Gait Ambulation/Gait assistance: Min guard Gait Distance (Feet): 15 Feet Assistive device: Rolling walker (2 wheeled) Gait Pattern/deviations: Step-to pattern;Decreased step length - left;Decreased step length - right;Decreased stance time - right Gait velocity: decreased   General Gait Details: step-to leading with RLE. Increased UE support using RW when WB through RLE and decreased stance time.  Stairs            Wheelchair Mobility    Modified Rankin (Stroke Patients Only)       Balance Overall balance assessment: Needs assistance Sitting-balance support: No upper extremity supported;Feet supported   Sitting balance - Comments: reported feeling dizzy initially; this improved with time   Standing balance support: Bilateral upper extremity supported;During functional activity   Standing balance comment: CGA for safety due to increased weight shift to LLE.                             Pertinent Vitals/Pain Pain Assessment: 0-10 Pain Score: 3  Pain Location: R hip Pain Descriptors / Indicators: Aching;Sharp;Sore Pain Intervention(s): Limited activity within patient's tolerance    Home Living Family/patient expects to be discharged to:: Private residence Living Arrangements: Spouse/significant other Available Help at Discharge: Family;Available 24 hours/day Type of Home: House Home Access: Stairs to enter Entrance Stairs-Rails: None Entrance Stairs-Number of Steps: 2 Home Layout: Two  level Home Equipment: Walker - 2 wheels;Cane - single point      Prior Function Level of Independence: Needs assistance         Comments: Husband assisted with dressing due to hip pain. No falls reported.     Hand Dominance   Dominant Hand: Right     Extremity/Trunk Assessment   Upper Extremity Assessment Upper Extremity Assessment: Overall WFL for tasks assessed    Lower Extremity Assessment Lower Extremity Assessment: RLE deficits/detail;Overall WFL for tasks assessed RLE Deficits / Details: MMT NT due to recent surgery and pain    Cervical / Trunk Assessment Cervical / Trunk Assessment: Normal  Communication   Communication: No difficulties  Cognition Arousal/Alertness: Awake/alert Behavior During Therapy: WFL for tasks assessed/performed Overall Cognitive Status: Within Functional Limits for tasks assessed                                 General Comments: A&O x4      General Comments      Exercises Total Joint Exercises Ankle Circles/Pumps: AROM;20 reps;Supine Quad Sets: AROM;10 reps;Supine Gluteal Sets: AROM;10 reps;Supine Other Exercises Other Exercises: Pt education re: PT role/POC, WB precautions (WBAT), posterior hip precautions, d/c recs, DME recs, BP response to stress and blood loss, safety with mobility. Educated on remaining exercises in packet however not completed. Other Exercises: Ambulatory toilet transfer, toileting and hand hygiene. SUP for seated balance, CGA during standing and ambulation. 50ft x 2 reps, standing x2 minutes during hand hygiene, sitting balance >5 minutes.   Assessment/Plan    PT Assessment Patient needs continued PT services  PT Problem List Decreased strength;Decreased mobility;Decreased knowledge of precautions;Decreased range of motion;Decreased activity tolerance;Decreased balance       PT Treatment Interventions DME instruction;Therapeutic activities;Gait training;Therapeutic exercise;Patient/family education;Stair training;Balance training;Functional mobility training;Neuromuscular re-education    PT Goals (Current goals can be found in the Care Plan section)  Acute Rehab PT Goals Patient Stated Goal: "Get up and get moving. Go home." PT Goal Formulation:  With patient Time For Goal Achievement: 05/09/21 Potential to Achieve Goals: Good    Frequency BID   Barriers to discharge        Co-evaluation               AM-PAC PT "6 Clicks" Mobility  Outcome Measure Help needed turning from your back to your side while in a flat bed without using bedrails?: A Little Help needed moving from lying on your back to sitting on the side of a flat bed without using bedrails?: A Little Help needed moving to and from a bed to a chair (including a wheelchair)?: A Little Help needed standing up from a chair using your arms (e.g., wheelchair or bedside chair)?: A Little Help needed to walk in hospital room?: A Little Help needed climbing 3-5 steps with a railing? : A Little 6 Click Score: 18    End of Session Equipment Utilized During Treatment: Gait belt Activity Tolerance: Patient tolerated treatment well;Patient limited by pain Patient left: in bed;with call bell/phone within reach;with SCD's reapplied;with family/visitor present;with nursing/sitter in room Nurse Communication: Mobility status;Other (comment) (pt voided) PT Visit Diagnosis: Unsteadiness on feet (R26.81);Other abnormalities of gait and mobility (R26.89);Pain Pain - Right/Left: Right Pain - part of body: Hip    Time: 1400-1450 PT Time Calculation (min) (ACUTE ONLY): 50 min   Charges:   PT Evaluation $PT Eval Low Complexity: 1 Low PT Treatments $Gait  Training: 8-22 mins $Therapeutic Activity: 8-22 mins        Basilia Jumbo PT, DPT 04/25/21 4:43 PM [phone]   Lavenia Atlas 04/25/2021, 4:43 PM

## 2021-04-25 NOTE — Op Note (Signed)
OPERATIVE NOTE  DATE OF SURGERY:  04/25/2021  PATIENT NAME:  Miranda Daniel   DOB: 1946/03/11  MRN: [id number]  PRE-OPERATIVE DIAGNOSIS: Degenerative arthrosis of the right hip, primary  POST-OPERATIVE DIAGNOSIS:  Same  PROCEDURE:  Right total hip arthroplasty  SURGEON:  Jena Gauss. M.D.  ASSISTANT: Baldwin Jamaica, PA-C (present and scrubbed throughout the case, critical for assistance with exposure, retraction, instrumentation, and closure)  ANESTHESIA: spinal  ESTIMATED BLOOD LOSS: 150 mL  FLUIDS REPLACED: 1500 mL of crystalloid  DRAINS: 2 medium Hemovac drains   IMPLANTS UTILIZED: DePuy 15 mm large stature AML femoral stem, 54 mm OD Pinnacle 100 acetabular component, neutral Pinnacle Altrx polyethylene insert, and a 36 mm M-SPEC +1.5 mm hip ball  INDICATIONS FOR SURGERY: Miranda Daniel is a 75 y.o. year old female with a long history of progressive hip and groin  pain. X-rays demonstrated severe degenerative changes. The patient had not seen any significant improvement despite conservative nonsurgical intervention. After discussion of the risks and benefits of surgical intervention, the patient expressed understanding of the risks benefits and agree with plans for total hip arthroplasty.   The risks, benefits, and alternatives were discussed at length including but not limited to the risks of infection, bleeding, nerve injury, stiffness, blood clots, the need for revision surgery, limb length inequality, dislocation, cardiopulmonary complications, among others, and they were willing to proceed.  PROCEDURE IN DETAIL: The patient was brought into the operating room and, after adequate spinal anesthesia was achieved, the patient was placed in a left lateral decubitus position. Axillary roll was placed and all bony prominences were well-padded. The patient's right hip was cleaned and prepped with alcohol and DuraPrep and draped in the usual sterile fashion. A "timeout" was performed  as per usual protocol. A lateral curvilinear incision was made gently curving towards the posterior superior iliac spine. The IT band was incised in line with the skin incision and the fibers of the gluteus maximus were split in line. The piriformis tendon was identified, skeletonized, and incised at its insertion to the proximal femur and reflected posteriorly. A T type posterior capsulotomy was performed. Prior to dislocation of the femoral head, a threaded Steinmann pin was inserted through a separate stab incision into the pelvis superior to the acetabulum and bent in the form of a stylus so as to assess limb length and hip offset throughout the procedure. The femoral head was then dislocated posteriorly. Inspection of the femoral head demonstrated severe degenerative changes with full-thickness loss of articular cartilage. The femoral neck cut was performed using an oscillating saw. The anterior capsule was elevated off of the femoral neck using a periosteal elevator. Attention was then directed to the acetabulum. The remnant of the labrum was excised using electrocautery. Inspection of the acetabulum also demonstrated significant degenerative changes. The acetabulum was reamed in sequential fashion up to a 53 mm diameter. Good punctate bleeding bone was encountered. A 54 mm Pinnacle 100 acetabular component was positioned and impacted into place. Good scratch fit was appreciated. A neutral polyethylene trial was inserted.  Attention was then directed to the proximal femur. A hole for reaming of the proximal femoral canal was created using a high-speed burr. The femoral canal was reamed in sequential fashion up to a 14.5 mm diameter. This allowed for approximately 6 cm of scratch fit.  It was thus elected to ream up to a 15 mm diameter to allow for a line to line fit.  Serial broaches were inserted  up to a 15 mm large stature femoral broach. Calcar region was planed and a trial reduction was performed using a  36 mm hip ball with a +1.5 mm neck length. Good equalization of limb lengths and hip offset was appreciated and excellent stability was noted both anteriorly and posteriorly. Trial components were removed. The acetabular shell was irrigated with copious amounts of normal saline with antibiotic solution and suctioned dry. A neutral Pinnacle Altrx polyethylene insert was positioned and impacted into place. Next, a 15 mm large stature AML femoral stem was positioned and impacted into place. Excellent scratch fit was appreciated. A trial reduction was again performed with a 36 mm hip ball with a +1.5 mm neck length. Again, good equalization of limb lengths was appreciated and excellent stability appreciated both anteriorly and posteriorly. The hip was then dislocated and the trial hip ball was removed. The Morse taper was cleaned and dried. A 36 mm M-SPEC hip ball with a +1.5 mm neck length was placed on the trunnion and impacted into place. The hip was then reduced and placed through range of motion. Excellent stability was appreciated both anteriorly and posteriorly.  The wound was irrigated with copious amounts of normal saline followed by 500 ml of Surgiphor and suctioned dry. Good hemostasis was appreciated. The posterior capsulotomy was repaired using #5 Ethibond. Piriformis tendon was reapproximated to the undersurface of the gluteus medius tendon using #5 Ethibond. The IT band was reapproximated using interrupted sutures of #1 Vicryl. Subcutaneous tissue was approximated using first #0 Vicryl followed by #2-0 Vicryl. The skin was closed with skin staples.  The patient tolerated the procedure well and was transported to the recovery room in stable condition.   Jena Gauss., M.D.

## 2021-04-25 NOTE — H&P (Signed)
The patient has been re-examined, and the chart reviewed, and there have been no interval changes to the documented history and physical.    The risks, benefits, and alternatives have been discussed at length. The patient expressed understanding of the risks benefits and agreed with plans for surgical intervention.  Leomia Blake P. Glendell Schlottman, [name] M.D.    

## 2021-04-25 NOTE — Anesthesia Preprocedure Evaluation (Signed)
Anesthesia Evaluation  Patient identified by MRN, date of birth, ID band Patient awake    Reviewed: Allergy & Precautions, NPO status , Patient's Chart, lab work & pertinent test results  History of Anesthesia Complications Negative for: history of anesthetic complications  Airway Mallampati: II  TM Distance: >3 FB Neck ROM: Full    Dental  (+) Poor Dentition   Pulmonary neg pulmonary ROS, neg sleep apnea, neg COPD,    breath sounds clear to auscultation- rhonchi (-) wheezing      Cardiovascular Exercise Tolerance: Good (-) hypertension(-) CAD, (-) Past MI, (-) Cardiac Stents and (-) CABG  Rhythm:Regular Rate:Normal - Systolic murmurs and - Diastolic murmurs    Neuro/Psych neg Seizures negative neurological ROS  negative psych ROS   GI/Hepatic Neg liver ROS, GERD  ,  Endo/Other  negative endocrine ROSneg diabetes  Renal/GU negative Renal ROS     Musculoskeletal  (+) Arthritis ,   Abdominal (+) + obese,   Peds  Hematology negative hematology ROS (+)   Anesthesia Other Findings Past Medical History: No date: GERD (gastroesophageal reflux disease)   Reproductive/Obstetrics                             Anesthesia Physical Anesthesia Plan  ASA: 2  Anesthesia Plan: Spinal   Post-op Pain Management:    Induction:   PONV Risk Score and Plan: 2 and Propofol infusion  Airway Management Planned: Natural Airway  Additional Equipment:   Intra-op Plan:   Post-operative Plan:   Informed Consent: I have reviewed the patients History and Physical, chart, labs and discussed the procedure including the risks, benefits and alternatives for the proposed anesthesia with the patient or authorized representative who has indicated his/her understanding and acceptance.     Dental advisory given  Plan Discussed with: CRNA and Anesthesiologist  Anesthesia Plan Comments:         Anesthesia  Quick Evaluation

## 2021-04-25 NOTE — Plan of Care (Signed)
  Problem: Education: Goal: Knowledge of General Education information will improve Description: Including pain rating scale, medication(s)/side effects and non-pharmacologic comfort measures Outcome: Progressing   Problem: Health Behavior/Discharge Planning: Goal: Ability to manage health-related needs will improve Outcome: Progressing   Problem: Clinical Measurements: Goal: Ability to maintain clinical measurements within normal limits will improve Outcome: Progressing Goal: Will remain free from infection Outcome: Progressing Goal: Diagnostic test results will improve Outcome: Progressing Goal: Respiratory complications will improve Outcome: Progressing Goal: Cardiovascular complication will be avoided Outcome: Progressing   Problem: Activity: Goal: Risk for activity intolerance will decrease Outcome: Progressing   Problem: Coping: Goal: Level of anxiety will decrease Outcome: Progressing   Problem: Elimination: Goal: Will not experience complications related to bowel motility Outcome: Progressing Goal: Will not experience complications related to urinary retention Outcome: Progressing   Problem: Pain Managment: Goal: General experience of comfort will improve Outcome: Progressing   Problem: Safety: Goal: Ability to remain free from injury will improve Outcome: Progressing   Problem: Skin Integrity: Goal: Risk for impaired skin integrity will decrease Outcome: Progressing   Problem: Activity: Goal: Ability to avoid complications of mobility impairment will improve Outcome: Progressing Goal: Ability to tolerate increased activity will improve Outcome: Progressing   Problem: Clinical Measurements: Goal: Postoperative complications will be avoided or minimized Outcome: Progressing   Problem: Pain Management: Goal: Pain level will decrease with appropriate interventions Outcome: Progressing

## 2021-04-25 NOTE — Transfer of Care (Signed)
Immediate Anesthesia Transfer of Care Note  Patient: Miranda Daniel  Procedure(s) Performed: RIGHT TOTAL HIP ARTHROPLASTY (Right: Hip)  Patient Location: PACU  Anesthesia Type:Spinal  Level of Consciousness: drowsy  Airway & Oxygen Therapy: Patient Spontanous Breathing and Patient connected to nasal cannula oxygen  Post-op Assessment: Report given to RN  Post vital signs: stable  Last Vitals:  Vitals Value Taken Time  BP 142/79 04/25/21 1125  Temp    Pulse 73 04/25/21 1128  Resp 16 04/25/21 1128  SpO2 100 % 04/25/21 1128  Vitals shown include unvalidated device data.  Last Pain:  Vitals:   04/25/21 0631  TempSrc: Temporal  PainSc: 0-No pain         Complications: No notable events documented.

## 2021-04-26 LAB — CBC
HCT: 36 % (ref 36.0–46.0)
Hemoglobin: 12.5 g/dL (ref 12.0–15.0)
MCH: 31.6 pg (ref 26.0–34.0)
MCHC: 34.7 g/dL (ref 30.0–36.0)
MCV: 91.1 fL (ref 80.0–100.0)
Platelets: 337 10*3/uL (ref 150–400)
RBC: 3.95 MIL/uL (ref 3.87–5.11)
RDW: 12.2 % (ref 11.5–15.5)
WBC: 9.1 10*3/uL (ref 4.0–10.5)
nRBC: 0 % (ref 0.0–0.2)

## 2021-04-26 MED ORDER — ENOXAPARIN SODIUM 40 MG/0.4ML IJ SOSY: 40.0000 mg | PREFILLED_SYRINGE | INTRAMUSCULAR | 0 refills | Status: DC

## 2021-04-26 MED ORDER — SENNOSIDES-DOCUSATE SODIUM 8.6-50 MG PO TABS
1.0000 | ORAL_TABLET | Freq: Two times a day (BID) | ORAL | Status: DC
Start: 2021-04-26 — End: 2021-07-06

## 2021-04-26 MED ORDER — CELECOXIB 200 MG PO CAPS: 200.0000 mg | ORAL_CAPSULE | Freq: Two times a day (BID) | ORAL | 0 refills | Status: DC

## 2021-04-26 MED ORDER — TRAMADOL HCL 50 MG PO TABS: 50.0000 mg | ORAL_TABLET | ORAL | 0 refills | Status: DC | PRN

## 2021-04-26 MED ORDER — OXYCODONE HCL 5 MG PO TABS: 5.0000 mg | ORAL_TABLET | ORAL | 0 refills | Status: DC | PRN

## 2021-04-26 MED ORDER — FLEET ENEMA 7-19 GM/118ML RE ENEM: 1.0000 | ENEMA | Freq: Once | RECTAL | 0 refills | Status: DC | PRN

## 2021-04-26 NOTE — Progress Notes (Signed)
Physical Therapy Treatment Patient Details Name: Miranda Daniel MRN: [id number] DOB: Feb 26, 1946 Today's Date: 04/26/2021    History of Present Illness 75 y.o. female s/p R THA 04/25/21. PMH significant for GERD and OA.    PT Comments    Pt feeling better this am, family at bedside and attentive to pt needs. Pt completed ROM exercises prior to supine to sit with MinA.  Sitting EOB x 5 minutes, no c/o dizziness. Transfers with CGA, Gait training out in hallway 159ft with RW, step through pattern and light CG/SBA.  Pt fatigued upon completion, left sitting up in recliner. Will return after lunch to practice stairs prior to d/c home.    Follow Up Recommendations  Home health PT     Equipment Recommendations  None recommended by PT    Recommendations for Other Services       Precautions / Restrictions Precautions Precautions: Fall;Posterior Hip Precaution Booklet Issued: Yes (comment) Precaution Comments: reviewed precautions Restrictions Weight Bearing Restrictions: Yes RLE Weight Bearing: Weight bearing as tolerated    Mobility  Bed Mobility Overal bed mobility: Needs Assistance Bed Mobility: Supine to Sit;Sit to Supine     Supine to sit: Min assist Sit to supine: Min assist   General bed mobility comments: Very attentive family (husband and daughter) providing assist at trunk and HHA for pt to pull on. MIN A by PT to manage RLE when returning to bed.    Transfers Overall transfer level: Needs assistance Equipment used: Rolling walker (2 wheeled) Transfers: Sit to/from Stand Sit to Stand: Min guard         General transfer comment: CGA to steady and for safety as this was pt first time out of bed after surgery. VC on technique to maintain precautions  Ambulation/Gait Ambulation/Gait assistance: Min guard Gait Distance (Feet): 180 Feet Assistive device: Rolling walker (2 wheeled) Gait Pattern/deviations: Step-through pattern     General Gait Details: Increased   tolerance for weight acceptance through R LE today   Stairs             Wheelchair Mobility    Modified Rankin (Stroke Patients Only)       Balance                                            Cognition Arousal/Alertness: Awake/alert Behavior During Therapy: WFL for tasks assessed/performed Overall Cognitive Status: Within Functional Limits for tasks assessed                                 General Comments: A&O x4      Exercises Total Joint Exercises Ankle Circles/Pumps: AROM;20 reps;Supine Quad Sets: AROM;10 reps;Supine Gluteal Sets: AROM;10 reps;Supine Heel Slides: AROM;Right;10 reps    General Comments        Pertinent Vitals/Pain Pain Assessment: 0-10 Pain Score: 6  Pain Location: R hip Pain Descriptors / Indicators: Aching;Sharp;Sore Pain Intervention(s): Monitored during session;Premedicated before session    Home Living Family/patient expects to be discharged to:: Private residence Living Arrangements: Spouse/significant other Available Help at Discharge: Family;Available 24 hours/day Type of Home: House Home Access: Stairs to enter Entrance Stairs-Rails: None Home Layout: Two level;Able to live on main level with bedroom/bathroom Home Equipment: Dan Humphreys - 2 wheels;Cane - single point      Prior Function Level of Independence: Needs  assistance  Gait / Transfers Assistance Needed: used SPC as needed   Comments: Husband assisted with dressing due to hip pain. No falls reported.   PT Goals (current goals can now be found in the care plan section) Acute Rehab PT Goals Patient Stated Goal: "Get up and get moving. Go home."    Frequency    BID      PT Plan Current plan remains appropriate    Co-evaluation              AM-PAC PT "6 Clicks" Mobility   Outcome Measure  Help needed turning from your back to your side while in a flat bed without using bedrails?: A Little Help needed moving from lying  on your back to sitting on the side of a flat bed without using bedrails?: A Little Help needed moving to and from a bed to a chair (including a wheelchair)?: A Little Help needed standing up from a chair using your arms (e.g., wheelchair or bedside chair)?: A Little Help needed to walk in hospital room?: A Little Help needed climbing 3-5 steps with a railing? : A Little 6 Click Score: 18    End of Session Equipment Utilized During Treatment: Gait belt Activity Tolerance: Patient tolerated treatment well;Patient limited by pain Patient left: in chair;with call bell/phone within reach;with family/visitor present Nurse Communication: Mobility status;Other (comment) PT Visit Diagnosis: Unsteadiness on feet (R26.81);Other abnormalities of gait and mobility (R26.89);Pain Pain - Right/Left: Right Pain - part of body: Hip     Time: 1046-1110 PT Time Calculation (min) (ACUTE ONLY): 24 min  Charges:  $Gait Training: 8-22 mins $Therapeutic Exercise: 8-22 mins                    Zadie Cleverly, PTA    Jannet Askew 04/26/2021, 12:33 PM

## 2021-04-26 NOTE — TOC Progression Note (Addendum)
Transition of Care [facility]) - Progression Note    Patient Details  Name: Miranda Daniel MRN: [id number] Date of Birth: 19-Jun-1946  [facility]) CM/SW Contact  Bing Quarry, RN Phone Number: 04/26/2021, 12:47 PM  Clinical Narrative:  Patient status changed to OBS pending potential discharge today. Originally, pre-admission plan to stay discussed was two full days, according to patient.  Phone number to her insurance company given to patient/family from records as she did not have that information on her. Daughter and spouse present in the room. Communicated encounter/information with Unit RN and printed e-signed Code 44 form to unit to be taken to patient's room messaged provider. Updated UR. Barbie Joseantonio Dittmar RN CM   120 pm. Confirmed with [facility] when discharged. Patient/spouse states she has both a Agricultural consultant and a BSC/3:1 at home. Gabriel Cirri RN CM        Expected Discharge Plan and Services           Expected Discharge Date: 04/26/21                                     Social Determinants of Health (SDOH) Interventions    Readmission Risk Interventions No flowsheet data found.

## 2021-04-26 NOTE — Evaluation (Signed)
Occupational Therapy Evaluation Patient Details Name: Miranda Daniel MRN: [id number] DOB: 10-10-1945 Today's Date: 04/26/2021    History of Present Illness 75 y.o. female s/p R THA 04/25/21. PMH significant for GERD and OA.   Clinical Impression   Pt seen for OT evaluation this date, POD#1 from above surgery. Pt was primarily independent in all ADLs prior to surgery, however occasionally using a SPC while ambulating, and with spouse assisting with LB dressing, due to R hip pain. Pt is eager to return to PLOF with less pain and improved safety and independence. Pt currently requires MinA for LB dressing while in seated position. Pt instructed in posterior total hip precautions and how to implement self care skills, falls prevention strategies, home/routines modifications, DME/AE for LB bathing and dressing tasks, compression stocking mgt strategies, and car transfer techniques. At end of session, pt able to recall 3/3 posterior total hip precautions. Pt has good support at home and all needed DME. No further OT services required at this time.     Follow Up Recommendations  No OT follow up    Equipment Recommendations  None recommended by OT    Recommendations for Other Services       Precautions / Restrictions Precautions Precautions: Fall;Posterior Hip Precaution Booklet Issued: Yes (comment) Precaution Comments: reviewed precautions Restrictions Weight Bearing Restrictions: Yes RLE Weight Bearing: Weight bearing as tolerated      Mobility Bed Mobility Overal bed mobility: Needs Assistance Bed Mobility: Supine to Sit;Sit to Supine     Supine to sit: Min guard Sit to supine: Min assist   General bed mobility comments: Very attentive family (husband and daughter) providing assist at trunk and HHA for pt to pull on. MIN A by PT to manage RLE when returning to bed.    Transfers Overall transfer level: Needs assistance Equipment used: Rolling walker (2 wheeled) Transfers: Sit  to/from Stand Sit to Stand: Min guard         General transfer comment: CGA to steady and for safety as this was pt first time out of bed after surgery. VC on technique to maintain precautions    Balance Overall balance assessment: Needs assistance Sitting-balance support: No upper extremity supported;Feet supported Sitting balance-Leahy Scale: Good     Standing balance support: Bilateral upper extremity supported;During functional activity Standing balance-Leahy Scale: Good                             ADL either performed or assessed with clinical judgement   ADL Overall ADL's : Needs assistance/impaired Eating/Feeding: Independent                       Toilet Transfer: Min guard           Functional mobility during ADLs: Min guard       Vision Patient Visual Report: No change from baseline       Perception     Praxis      Pertinent Vitals/Pain Pain Assessment: 0-10 Pain Score: 0-No pain Pain Location: R hip Pain Descriptors / Indicators: Aching;Sharp;Sore Pain Intervention(s): Monitored during session;Premedicated before session     Hand Dominance Right   Extremity/Trunk Assessment Upper Extremity Assessment Upper Extremity Assessment: Overall WFL for tasks assessed   Lower Extremity Assessment Lower Extremity Assessment: Overall WFL for tasks assessed RLE: Unable to fully assess due to pain   Cervical / Trunk Assessment Cervical / Trunk Assessment: Normal  Communication Communication Communication: No difficulties   Cognition Arousal/Alertness: Awake/alert Behavior During Therapy: WFL for tasks assessed/performed Overall Cognitive Status: Within Functional Limits for tasks assessed                                 General Comments: A&O x4   General Comments       Exercises Exercises: Total Joint Total Joint Exercises Ankle Circles/Pumps: AROM;20 reps;Supine Quad Sets: AROM;10 reps;Supine Gluteal  Sets: AROM;10 reps;Supine Heel Slides: AROM;Right;10 reps Other Exercises Other Exercises: Educ re: hip precautions, AE for LB dressing/bathing, safety   Shoulder Instructions      Home Living Family/patient expects to be discharged to:: Private residence Living Arrangements: Spouse/significant other Available Help at Discharge: Family;Available 24 hours/day Type of Home: House Home Access: Stairs to enter [employer] of Steps: 2 Entrance Stairs-Rails: None Home Layout: Two level;Able to live on main level with bedroom/bathroom     Bathroom Shower/Tub: Chief Strategy Officer: Standard     Home Equipment: Environmental consultant - 2 wheels;Cane - single point          Prior Functioning/Environment Level of Independence: Needs assistance  Gait / Transfers Assistance Needed: used SPC as needed     Comments: Husband assisted with dressing due to hip pain. No falls reported.        OT Problem List: Decreased strength;Decreased range of motion;Decreased activity tolerance;Impaired balance (sitting and/or standing);Decreased knowledge of use of DME or AE      OT Treatment/Interventions:      OT Goals(Current goals can be found in the care plan section) Acute Rehab OT Goals Patient Stated Goal: "Get up and get moving. Go home." OT Goal Formulation: With patient Time For Goal Achievement: 05/10/21 Potential to Achieve Goals: Good  OT Frequency:     Barriers to D/C:            Co-evaluation              AM-PAC OT "6 Clicks" Daily Activity     Outcome Measure Help from another person eating meals?: None Help from another person taking care of personal grooming?: None Help from another person toileting, which includes using toliet, bedpan, or urinal?: A Little Help from another person bathing (including washing, rinsing, drying)?: A Little Help from another person to put on and taking off regular upper body clothing?: None Help from another person to put  on and taking off regular lower body clothing?: A Lot 6 Click Score: 20   End of Session Equipment Utilized During Treatment: Rolling walker  Activity Tolerance: Patient tolerated treatment well Patient left: in chair;with family/visitor present;with call bell/phone within reach  OT Visit Diagnosis: Unsteadiness on feet (R26.81);Other abnormalities of gait and mobility (R26.89);Muscle weakness (generalized) (M62.81)                Time: 1140-1156 OT Time Calculation (min): 16 min Charges:  OT General Charges $OT Visit: 1 Visit OT Evaluation $OT Eval Low Complexity: 1 Low OT Treatments $Self Care/Home Management : 8-22 mins Latina Craver, PhD, MS, OTR/L 04/26/21, 12:41 PM

## 2021-04-26 NOTE — Progress Notes (Signed)
   Subjective: 1 Day Post-Op Procedure(s) (LRB): RIGHT TOTAL HIP ARTHROPLASTY (Right) Patient reports pain as mild.   Patient is well, and has had no acute complaints or problems Denies any CP, SOB, ABD pain. We will continue therapy today.  Plan is to go Home after hospital stay.  Objective: Vital signs in last 24 hours: Temp:  [97.6 F (36.4 C)-98.2 F (36.8 C)] 98.1 F (36.7 C) (07/23 0733) Pulse Rate:  [66-80] 72 (07/23 0733) Resp:  [11-18] 18 (07/23 0551) BP: (96-119)/(52-77) 102/55 (07/23 0733) SpO2:  [91 %-100 %] 96 % (07/23 0733)  Intake/Output from previous day: 07/22 0701 - 07/23 0700 In: 2800 [P.O.:240; I.V.:2150; IV Piggyback:410] Out: 880 [Urine:300; Drains:430; Blood:150] Intake/Output this shift: No intake/output data recorded.  No results for input(s): HGB in the last 72 hours. No results for input(s): WBC, RBC, HCT, PLT in the last 72 hours. No results for input(s): NA, K, CL, CO2, BUN, CREATININE, GLUCOSE, CALCIUM in the last 72 hours. No results for input(s): LABPT, INR in the last 72 hours.  EXAM General - Patient is Alert, Appropriate, and Oriented Extremity - Neurovascular intact Sensation intact distally Intact pulses distally Dorsiflexion/Plantar flexion intact Incision: dressing C/D/I and no drainage No cellulitis present Compartment soft Dressing - dressing C/D/I, Hemovac intact Motor Function - intact, moving foot and toes well on exam.   Past Medical History:  Diagnosis Date   GERD (gastroesophageal reflux disease)     Assessment/Plan:   1 Day Post-Op Procedure(s) (LRB): RIGHT TOTAL HIP ARTHROPLASTY (Right) Active Problems:   Hx of total hip arthroplasty, right  Estimated body mass index is 30.04 kg/m as calculated from the following:   Height as of this encounter: 5\' 7"  (1.702 m).   Weight as of this encounter: 87 kg. Advance diet Up with therapy Work on bowel movement Vital signs stable, slightly hypotensive but  asymptomatic.  We will continue to monitor with PT Care management to assist with discharge to home with home health PT.  Possible discharge to home today pending completion of PT goals.   DVT Prophylaxis - Lovenox, Foot Pumps, and TED hose Weight-Bearing as tolerated to right leg   T. , PA-C [facility] 04/26/2021, 10:00 AM

## 2021-04-26 NOTE — Progress Notes (Signed)
Physical Therapy Treatment Patient Details Name: Miranda Daniel MRN: [id number] DOB: 1946-08-18 Today's Date: 04/26/2021    History of Present Illness 75 y.o. female s/p R THA 04/25/21. PMH significant for GERD and OA.    PT Comments    Pt seen this pm for continued gait training 25ft with RW, CG/SBA with step through pattern. Pt and husband educated on proper technique for negotiation of 2 steps without hand rail utilizing RW.   Pt able to complete with vc's and husband stabilizing RW.  Handout given for reference.  Pt slightly more fatigued this pm with increase in R hip discomfort at 7/10, yet denies pain meds.  Continue PT per POC until d/c home possibly tomorrow.    Follow Up Recommendations  Home health PT     Equipment Recommendations  None recommended by PT    Recommendations for Other Services       Precautions / Restrictions Precautions Precautions: Fall;Posterior Hip Precaution Booklet Issued: Yes (comment) Precaution Comments: reviewed precautions, gave handout on stair negotiation. Restrictions Weight Bearing Restrictions: Yes RLE Weight Bearing: Weight bearing as tolerated    Mobility  Bed Mobility Overal bed mobility: Needs Assistance Bed Mobility: Supine to Sit;Sit to Supine     Supine to sit: Min guard Sit to supine: Min assist   General bed mobility comments: Very attentive family (husband and daughter) providing assist at trunk and HHA for pt to pull on. MIN A by PT to manage RLE when returning to bed.    Transfers Overall transfer level: Needs assistance Equipment used: Rolling walker (2 wheeled) Transfers: Sit to/from Stand Sit to Stand: Supervision         General transfer comment: Good demonstration of R LE positioning with transfers  Ambulation/Gait Ambulation/Gait assistance: Supervision;Min guard Gait Distance (Feet): 90 Feet Assistive device: Rolling walker (2 wheeled) Gait Pattern/deviations: Step-through pattern     General  Gait Details: Increased  tolerance for weight acceptance through R LE today   Stairs Stairs: Yes Stairs assistance: Min guard (Husband present to assist) Stair Management: No rails;Backwards;With walker Number of Stairs: 2 General stair comments: Husband with good understanding of proper stair negotiation/technique   Wheelchair Mobility    Modified Rankin (Stroke Patients Only)       Balance Overall balance assessment: Needs assistance Sitting-balance support: No upper extremity supported;Feet supported Sitting balance-Leahy Scale: Good     Standing balance support: Bilateral upper extremity supported;During functional activity Standing balance-Leahy Scale: Good                              Cognition Arousal/Alertness: Awake/alert Behavior During Therapy: WFL for tasks assessed/performed Overall Cognitive Status: Within Functional Limits for tasks assessed                                 General Comments: A&O x4      Exercises Total Joint Exercises Ankle Circles/Pumps: AROM;20 reps;Supine Quad Sets: AROM;10 reps;Supine Gluteal Sets: AROM;10 reps;Supine Heel Slides: AROM;Right;10 reps Other Exercises Other Exercises: Educ re: hip precautions, AE for LB dressing/bathing, safety    General Comments        Pertinent Vitals/Pain Pain Assessment: 0-10 Pain Score: 7  Pain Location: R hip Pain Descriptors / Indicators: Aching;Sharp;Sore Pain Intervention(s): Monitored during session    Home Living Family/patient expects to be discharged to:: Private residence Living Arrangements: Spouse/significant other Available Help at  Discharge: Family;Available 24 hours/day Type of Home: House Home Access: Stairs to enter Entrance Stairs-Rails: None Home Layout: Two level;Able to live on main level with bedroom/bathroom Home Equipment: Dan Humphreys - 2 wheels;Cane - single point      Prior Function Level of Independence: Needs assistance  Gait /  Transfers Assistance Needed: used SPC as needed   Comments: Husband assisted with dressing due to hip pain. No falls reported.   PT Goals (current goals can now be found in the care plan section) Acute Rehab PT Goals Patient Stated Goal: "Get up and get moving. Go home."    Frequency    BID      PT Plan Current plan remains appropriate    Co-evaluation              AM-PAC PT "6 Clicks" Mobility   Outcome Measure  Help needed turning from your back to your side while in a flat bed without using bedrails?: A Little Help needed moving from lying on your back to sitting on the side of a flat bed without using bedrails?: A Little Help needed moving to and from a bed to a chair (including a wheelchair)?: A Little Help needed standing up from a chair using your arms (e.g., wheelchair or bedside chair)?: A Little Help needed to walk in hospital room?: A Little Help needed climbing 3-5 steps with a railing? : A Little 6 Click Score: 18    End of Session Equipment Utilized During Treatment: Gait belt Activity Tolerance: Patient tolerated treatment well;Patient limited by pain Patient left: in chair;with call bell/phone within reach;with family/visitor present Nurse Communication: Mobility status;Other (comment) PT Visit Diagnosis: Unsteadiness on feet (R26.81);Other abnormalities of gait and mobility (R26.89);Pain Pain - Right/Left: Right Pain - part of body: Hip     Time: 0938-1829 PT Time Calculation (min) (ACUTE ONLY): 25 min  Charges:  $Gait Training: 23-37 mins $Therapeutic Exercise: 8-22 mins                    Zadie Cleverly, PTA    Jannet Askew 04/26/2021, 1:46 PM

## 2021-04-26 NOTE — Discharge Summary (Addendum)
Physician Discharge Summary  Patient ID: Miranda Daniel MRN: [id number] DOB/AGE: 1946/05/21 75 y.o.  Admit date: 04/25/2021 Discharge date: 04/28/2021  Admission Diagnoses:  Hx of total hip arthroplasty, right [Z96.641]   Discharge Diagnoses: Patient Active Problem List   Diagnosis Date Noted   Hx of total hip arthroplasty, right 04/25/2021   Primary osteoarthritis of left hip 01/10/2021   Primary osteoarthritis of right hip 01/10/2021    Past Medical History:  Diagnosis Date   GERD (gastroesophageal reflux disease)      Transfusion: none   Consultants (if any):   Discharged Condition: Improved  Hospital Course: Miranda Daniel is an 75 y.o. female who was admitted 04/25/2021 with a diagnosis of right hip osteoarthritis and went to the operating room on 04/25/2021 and underwent the above named procedures.    Surgeries: Procedure(s): RIGHT TOTAL HIP ARTHROPLASTY on 04/25/2021 Patient tolerated the surgery well. Taken to PACU where she was stabilized and then transferred to the orthopedic floor.  Started on Lovenox 40 mg q 24 hrs. Foot pumps applied bilaterally at 80 mm. Heels elevated on bed with rolled towels. No evidence of DVT. Negative Homan. Physical therapy started on day #1 for gait training and transfer. OT started day #1 for ADL and assisted devices.  Patient's foley was d/c on day #1. Patient's IV and hemovac was d/c on day #1.  On post op day #1 patient made excellent progress of physical therapy and was able to complete all physical therapy goals.  Patient was stable and ready for discharge to home with HHPT.  Implants: DePuy 15 mm large stature AML femoral stem, 54 mm OD Pinnacle 100 acetabular component, neutral Pinnacle Altrx polyethylene insert, and a 36 mm M-SPEC +1.5 mm hip ball    She was given perioperative antibiotics:  Anti-infectives (From admission, onward)    Start     Dose/Rate Route Frequency Ordered Stop   04/25/21 1400  ceFAZolin (ANCEF) IVPB  2g/100 mL premix        2 g 200 mL/hr over 30 Minutes Intravenous Every 6 hours 04/25/21 1128 04/26/21 2002   04/25/21 0623  ceFAZolin (ANCEF) 2-4 GM/100ML-% IVPB       Note to Pharmacy: Christene Slates   : cabinet override      04/25/21 0623 04/25/21 0810   04/25/21 0600  ceFAZolin (ANCEF) IVPB 2g/100 mL premix        2 g 200 mL/hr over 30 Minutes Intravenous On call to O.R. 04/25/21 0022 04/25/21 0754     .  She was given sequential compression devices, early ambulation, and Lovenox, teds for DVT prophylaxis.  She benefited maximally from the hospital stay and there were no complications.    Recent vital signs:  Vitals:   04/28/21 0420 04/28/21 0756  BP: 120/63 118/64  Pulse: 81 100  Resp: 17 15  Temp: (!) 97.4 F (36.3 C) 98.1 F (36.7 C)  SpO2: 98% 98%    Recent laboratory studies:  Lab Results  Component Value Date   HGB 11.6 (L) 04/27/2021   HGB 12.5 04/26/2021   Lab Results  Component Value Date   WBC 9.0 04/27/2021   PLT 310 04/27/2021   Lab Results  Component Value Date   INR 0.9 04/14/2021   Lab Results  Component Value Date   NA 137 04/27/2021   K 3.8 04/27/2021   CL 105 04/27/2021   CO2 26 04/27/2021   BUN 10 04/27/2021   CREATININE 0.85 04/27/2021   GLUCOSE 115 (  H) 04/27/2021    Discharge Medications:   Allergies as of 04/28/2021       Reactions   Augmentin [amoxicillin-pot Clavulanate] Other (See Comments)   Upset stomach         Medication List     STOP taking these medications    ibuprofen 200 MG tablet Commonly known as: ADVIL   naproxen sodium 220 MG tablet Commonly known as: ALEVE       TAKE these medications    acetaminophen 500 MG tablet Commonly known as: TYLENOL Take 1,000 mg by mouth every 8 (eight) hours as needed for moderate pain.   celecoxib 200 MG capsule Commonly known as: CELEBREX Take 1 capsule (200 mg total) by mouth 2 (two) times daily.   enoxaparin 40 MG/0.4ML injection Commonly known as:  LOVENOX Inject 0.4 mLs (40 mg total) into the skin daily for 14 days.   esomeprazole 20 MG capsule Commonly known as: NEXIUM Take 20 mg by mouth daily as needed (acid reflux).   oxyCODONE 5 MG immediate release tablet Commonly known as: Oxy IR/ROXICODONE Take 1-2 tablets (5-10 mg total) by mouth every 4 (four) hours as needed for moderate pain (pain score 4-6).   senna-docusate 8.6-50 MG tablet Commonly known as: Senokot-S Take 1 tablet by mouth 2 (two) times daily.   sodium phosphate 7-19 GM/118ML Enem Place 133 mLs (1 enema total) rectally once as needed for severe constipation.   traMADol 50 MG tablet Commonly known as: ULTRAM Take 1-2 tablets (50-100 mg total) by mouth every 4 (four) hours as needed for moderate pain.               Durable Medical Equipment  (From admission, onward)           Start     Ordered   04/25/21 1302  DME Walker rolling  Once       Question:  Patient needs a walker to treat with the following condition  Answer:  S/P total hip arthroplasty   04/25/21 1301   04/25/21 1302  DME Bedside commode  Once       Question:  Patient needs a bedside commode to treat with the following condition  Answer:  S/P total hip arthroplasty   04/25/21 1301            Diagnostic Studies: DG Chest 2 View  Result Date: 04/27/2021 CLINICAL DATA:  Tachycardia, concern for infection. EXAM: CHEST - 2 VIEW COMPARISON:  Report from chest radiograph dated 07/23/2001. FINDINGS: The heart size is normal. Vascular calcifications are seen in the aortic arch. There is minimal left basilar atelectasis laterally. The right lung is clear. There is no pleural effusion or pneumothorax. Degenerative changes are seen in the spine. IMPRESSION: Minimal left basilar atelectasis. Electronically Signed   By: Romona Curls M.D.   On: 04/27/2021 09:45   CT Angio Chest Pulmonary Embolism (PE) W or WO Contrast  Result Date: 04/27/2021 CLINICAL DATA:  Tachycardia. EXAM: CT ANGIOGRAPHY  CHEST WITH CONTRAST TECHNIQUE: Multidetector CT imaging of the chest was performed using the standard protocol during bolus administration of intravenous contrast. Multiplanar CT image reconstructions and MIPs were obtained to evaluate the vascular anatomy. CONTRAST:  16mL OMNIPAQUE IOHEXOL 350 MG/ML SOLN COMPARISON:  None. FINDINGS: Cardiovascular: Satisfactory opacification of the pulmonary arteries to the segmental level. No evidence of pulmonary embolism. Normal heart size. No pericardial effusion. Coronary artery calcifications are noted. Mediastinum/Nodes: No enlarged mediastinal, hilar, or axillary lymph nodes. Thyroid gland, trachea, and esophagus demonstrate  no significant findings. Lungs/Pleura: Lungs are clear. No pleural effusion or pneumothorax. Upper Abdomen: Hepatic steatosis. Musculoskeletal: No chest wall abnormality. No acute or significant osseous findings. Review of the MIP images confirms the above findings. IMPRESSION: No definite evidence of pulmonary embolus. Hepatic steatosis. Coronary artery calcifications are noted suggesting coronary artery disease. Aortic Atherosclerosis (ICD10-I70.0). Electronically Signed   By: Lupita Raider M.D.   On: 04/27/2021 19:30   DG Hip Port Unilat With Pelvis 1V Right  Result Date: 04/25/2021 CLINICAL DATA:  Status post hip arthroplasty EXAM: DG HIP (WITH OR WITHOUT PELVIS) 1V PORT RIGHT COMPARISON:  None. FINDINGS: Status post RIGHT hip arthroplasty. Orthopedic hardware is intact and without periprosthetic fracture or lucency. Small volume adjacent air. Overlying surgical drain and surgical staples. Osteopenia. Severe degenerative changes of the LEFT hip. Pelvic phleboliths. IMPRESSION: Expected postsurgical changes status post RIGHT hip arthroplasty. Electronically Signed   By: Meda Klinefelter MD   On: 04/25/2021 14:13    Disposition: home with home health PT      Follow-up Information     Hooten, Illene Labrador, MD Follow up on 06/10/2021.    Specialty: Orthopedic Surgery Why: at 10:30am Contact information: [address] [facility] [place] [address] [phone]                  Signed: Madelyn Flavors 04/28/2021, 11:31 AM

## 2021-04-26 NOTE — Care Management CC44 (Signed)
Condition Code 44 Documentation Completed  Patient Details  Name: JEANA KERSTING MRN: [id number] Date of Birth: 02-11-1946   Condition Code 44 given:  Yes Patient signature on Condition Code 44 notice:  Yes Documentation of 2 MD's agreement:  Yes Code 44 added to claim:  Yes Form was explained to patient in company of daughter and spouse. Patient/daughter/spouse agreed to signing form as explained to them only. Gabriel Cirri RN CM   Bing Quarry, RN 04/26/2021, 12:29 PM

## 2021-04-27 ENCOUNTER — Observation Stay: Payer: Medicare Other

## 2021-04-27 ENCOUNTER — Inpatient Hospital Stay: Payer: Medicare Other

## 2021-04-27 DIAGNOSIS — Z880 Allergy status to penicillin: Secondary | ICD-10-CM | POA: Diagnosis not present

## 2021-04-27 DIAGNOSIS — K219 Gastro-esophageal reflux disease without esophagitis: Secondary | ICD-10-CM | POA: Diagnosis present

## 2021-04-27 DIAGNOSIS — M1611 Unilateral primary osteoarthritis, right hip: Secondary | ICD-10-CM | POA: Diagnosis present

## 2021-04-27 LAB — URINALYSIS, COMPLETE (UACMP) WITH MICROSCOPIC
Bacteria, UA: NONE SEEN
Bilirubin Urine: NEGATIVE
Glucose, UA: NEGATIVE mg/dL
Hgb urine dipstick: NEGATIVE
Ketones, ur: NEGATIVE mg/dL
Nitrite: NEGATIVE
Protein, ur: NEGATIVE mg/dL
Specific Gravity, Urine: 1.006 (ref 1.005–1.030)
pH: 6 (ref 5.0–8.0)

## 2021-04-27 LAB — BASIC METABOLIC PANEL
Anion gap: 6 (ref 5–15)
BUN: 10 mg/dL (ref 8–23)
CO2: 26 mmol/L (ref 22–32)
Calcium: 7.9 mg/dL — ABNORMAL LOW (ref 8.9–10.3)
Chloride: 105 mmol/L (ref 98–111)
Creatinine, Ser: 0.85 mg/dL (ref 0.44–1.00)
GFR, Estimated: >60 mL/min (ref >60)
Glucose, Bld: 115 mg/dL — ABNORMAL HIGH (ref 70–99)
Potassium: 3.8 mmol/L (ref 3.5–5.1)
Sodium: 137 mmol/L (ref 135–145)

## 2021-04-27 LAB — CBC
HCT: 34.1 % — ABNORMAL LOW (ref 36.0–46.0)
Hemoglobin: 11.6 g/dL — ABNORMAL LOW (ref 12.0–15.0)
MCH: 30.7 pg (ref 26.0–34.0)
MCHC: 34 g/dL (ref 30.0–36.0)
MCV: 90.2 fL (ref 80.0–100.0)
Platelets: 310 10*3/uL (ref 150–400)
RBC: 3.78 MIL/uL — ABNORMAL LOW (ref 3.87–5.11)
RDW: 12.7 % (ref 11.5–15.5)
WBC: 9 10*3/uL (ref 4.0–10.5)
nRBC: 0 % (ref 0.0–0.2)

## 2021-04-27 MED ORDER — IOHEXOL 350 MG/ML SOLN
75.0000 mL | Freq: Once | INTRAVENOUS | Status: AC | PRN
  Administered 2021-04-27: 75 mL via INTRAVENOUS

## 2021-04-27 MED ORDER — SODIUM CHLORIDE 0.9 % IV BOLUS
1000.0000 mL | Freq: Once | INTRAVENOUS | Status: AC
  Administered 2021-04-27: 1000 mL via INTRAVENOUS

## 2021-04-27 NOTE — Progress Notes (Signed)
   Subjective: 2 Days Post-Op Procedure(s) (LRB): RIGHT TOTAL HIP ARTHROPLASTY (Right) Patient reports pain as mild.   Patient noted to have elevated heart rate of 120 this morning.  Vital signs obtained after patient had ambulated to the bathroom and back to her bed.  She denies any episodes of palpitations, chest pain, shortness of breath.  No dizziness, lightheadedness.  States she has been using the bathroom quite a bit throughout the night.  She has been drinking and tolerating fluids well.  She denies any chills or fevers.  Heart rate down to 110 after lying back in bed. Denies any CP, SOB, ABD pain. We will continue therapy today.  Plan is to go Home after hospital stay.  Objective: Vital signs in last 24 hours: Temp:  [98.4 F (36.9 C)-99.1 F (37.3 C)] 98.7 F (37.1 C) (07/24 0907) Pulse Rate:  [71-122] 110 (07/24 0907) Resp:  [15-20] 18 (07/24 0907) BP: (101-122)/(54-66) 104/62 (07/24 0907) SpO2:  [92 %-97 %] 97 % (07/24 0907)  Intake/Output from previous day: 07/23 0701 - 07/24 0700 In: 2240.6 [P.O.:1080; I.V.:1160.6] Out: 70 [Drains:70] Intake/Output this shift: No intake/output data recorded.  Recent Labs    04/26/21 1018  HGB 12.5   Recent Labs    04/26/21 1018  WBC 9.1  RBC 3.95  HCT 36.0  PLT 337   No results for input(s): NA, K, CL, CO2, BUN, CREATININE, GLUCOSE, CALCIUM in the last 72 hours. No results for input(s): LABPT, INR in the last 72 hours.  EXAM General - Patient is Alert, Appropriate, and Oriented Extremity - Neurovascular intact Sensation intact distally Intact pulses distally Dorsiflexion/Plantar flexion intact Incision: dressing C/D/I and no drainage No cellulitis present Compartment soft Dressing - dressing C/D/I, Motor Function - intact, moving foot and toes well on exam.   Past Medical History:  Diagnosis Date   GERD (gastroesophageal reflux disease)     Assessment/Plan:   2 Days Post-Op Procedure(s) (LRB): RIGHT TOTAL  HIP ARTHROPLASTY (Right) Active Problems:   Hx of total hip arthroplasty, right  Estimated body mass index is 30.04 kg/m as calculated from the following:   Height as of this encounter: 5\' 7"  (1.702 m).   Weight as of this encounter: 87 kg. Advance diet Up with therapy Work on bowel movement Patient tachycardic with soft blood pressure.  Will give a bolus of fluids and continue to monitor heart rate at rest as well as with physical therapy this morning.  Patient currently asymptomatic.  EKG showed sinus tach.  Will order chest x-ray and urinalysis to rule out any type of infectious process.  Do not suspect pulmonary embolism.  No swelling or pain in lower extremities. Care management to assist with discharge to home with home health PT.  Possible discharge to home today pending improvement of heart rate and progress with physical therapy.   DVT Prophylaxis - Lovenox, Foot Pumps, and TED hose Weight-Bearing as tolerated to right leg   T. , PA-C [facility] 04/27/2021, 9:11 AM

## 2021-04-27 NOTE — Anesthesia Postprocedure Evaluation (Signed)
Anesthesia Post Note  Patient: Miranda Daniel  Procedure(s) Performed: RIGHT TOTAL HIP ARTHROPLASTY (Right: Hip)  Patient location during evaluation: Nursing Unit Anesthesia Type: Spinal Level of consciousness: awake and alert Pain management: pain level controlled Respiratory status: spontaneous breathing and nonlabored ventilation Cardiovascular status: tachycardic (Low normal BP) Postop Assessment: no headache and no backache Anesthetic complications: no   No notable events documented.   Last Vitals:  Vitals:   04/27/21 0810 04/27/21 0907  BP: 116/61 104/62  Pulse: (!) 122 (!) 110  Resp: 20 18  Temp: 36.9 C 37.1 C  SpO2: 94% 97%    Last Pain:  Vitals:   04/27/21 0810  TempSrc: Oral  PainSc:                  Lannette Donath

## 2021-04-27 NOTE — Progress Notes (Signed)
Physical Therapy Treatment Patient Details Name: Miranda Daniel MRN: [id number] DOB: August 04, 1946 Today's Date: 04/27/2021    History of Present Illness 75 y.o. female s/p R THA 04/25/21. PMH significant for GERD and OA.    PT Comments    HR noted to be elevated this am.  Monitored throughout session.  Participated in exercises as described below.  To EOB with increased time, rails and min a x 1.  Steady in sitting.  Some dizziness noted relieved quickly with time.  She is able to stand with min a x 1 and walk 90'.  HR checked x 3 during gait and progressively increasing up to 128 BPM at 90'.  She does report fatigue and she is encouraged to sit.  Returned to room and RN notified of need to void for urine catch.  RN and PA notified of continued increased HR with gait.   Follow Up Recommendations  Home health PT     Equipment Recommendations  None recommended by PT    Recommendations for Other Services       Precautions / Restrictions Precautions Precautions: Fall;Posterior Hip Precaution Booklet Issued: Yes (comment) Precaution Comments: reviewed precautions, gave handout on stair negotiation. Restrictions Weight Bearing Restrictions: Yes RLE Weight Bearing: Weight bearing as tolerated    Mobility  Bed Mobility Overal bed mobility: Needs Assistance Bed Mobility: Supine to Sit   Sidelying to sit: Min assist            Transfers Overall transfer level: Needs assistance Equipment used: Rolling walker (2 wheeled) Transfers: Sit to/from Stand Sit to Stand: Min assist            Ambulation/Gait Ambulation/Gait assistance: Min guard Gait Distance (Feet): 90 Feet Assistive device: Rolling walker (2 wheeled) Gait Pattern/deviations: Step-through pattern Gait velocity: decreased   General Gait Details: increased fatigue today and HR 128 max - further gait limited by Clinical research associate.   Stairs             Wheelchair Mobility    Modified Rankin (Stroke Patients  Only)       Balance Overall balance assessment: Needs assistance Sitting-balance support: No upper extremity supported;Feet supported Sitting balance-Leahy Scale: Good     Standing balance support: Bilateral upper extremity supported;During functional activity Standing balance-Leahy Scale: Good                              Cognition Arousal/Alertness: Awake/alert Behavior During Therapy: WFL for tasks assessed/performed Overall Cognitive Status: Within Functional Limits for tasks assessed                                        Exercises Total Joint Exercises Ankle Circles/Pumps: AROM;20 reps;Supine Quad Sets: AROM;10 reps;Supine Gluteal Sets: AROM;10 reps;Supine Heel Slides: AROM;Right;10 reps    General Comments        Pertinent Vitals/Pain Pain Assessment: Faces Faces Pain Scale: Hurts a little bit Pain Location: R hip Pain Descriptors / Indicators: Aching;Sharp;Sore Pain Intervention(s): Limited activity within patient's tolerance;Monitored during session;Repositioned    Home Living                      Prior Function            PT Goals (current goals can now be found in the care plan section) Progress towards PT goals: Not progressing toward  goals - comment    Frequency    BID      PT Plan Current plan remains appropriate    Co-evaluation              AM-PAC PT "6 Clicks" Mobility   Outcome Measure  Help needed turning from your back to your side while in a flat bed without using bedrails?: A Little Help needed moving from lying on your back to sitting on the side of a flat bed without using bedrails?: A Little Help needed moving to and from a bed to a chair (including a wheelchair)?: A Little Help needed standing up from a chair using your arms (e.g., wheelchair or bedside chair)?: A Little Help needed to walk in hospital room?: A Little Help needed climbing 3-5 steps with a railing? : A Little 6  Click Score: 18    End of Session Equipment Utilized During Treatment: Gait belt Activity Tolerance: Treatment limited secondary to medical complications (Comment);Patient limited by fatigue Patient left: in chair;with call bell/phone within reach;with family/visitor present Nurse Communication: Mobility status;Other (comment) PT Visit Diagnosis: Unsteadiness on feet (R26.81);Other abnormalities of gait and mobility (R26.89);Pain Pain - Right/Left: Right Pain - part of body: Hip     Time: 2836-6294 PT Time Calculation (min) (ACUTE ONLY): 23 min  Charges:  $Gait Training: 8-22 mins $Therapeutic Exercise: 8-22 mins                    Danielle Dess, PTA 04/27/21, 12:46 PM 04/27/2021, 12:42 PM

## 2021-04-27 NOTE — Progress Notes (Signed)
Discussed afternoon PT session with physical therapist.  Patient with heart rate as high as 128 with oxygen saturations as low as 88.  No reports of chest pain or shortness of breath.  Evaluated patient in the room, she appears well in no respiratory distress and vital signs improved at rest.  Will rule out PE with CT angiogram.  Discussed CT angiogram with patient as well as treatment options pending CT results.  Husband and wife in agreement with CT angiogram.

## 2021-04-27 NOTE — Progress Notes (Addendum)
Physical Therapy Treatment Patient Details Name: Miranda Daniel MRN: [id number] DOB: 17-Feb-1946 Today's Date: 04/27/2021    History of Present Illness 75 y.o. female s/p R THA 04/25/21. PMH significant for GERD and OA.    PT Comments    BID session to check HR response in hopes of ability to discharge home this pm.  Pt with HR 111-120 at rest.  OOB and walked to door and hR increased to 128.  She was cued to return to room and was assisted back to bed.    Of note, O2 88-91% with mobility.  No chest pain, SOB or other complaints and resting comfortably in bed.  DM to RN and PA in regards to session.  I did call PA to make direct voice contact.   Plan to check for PE.  Will check results in AM and proceed as appropriate.   Follow Up Recommendations  Home health PT     Equipment Recommendations  None recommended by PT    Recommendations for Other Services       Precautions / Restrictions Precautions Precautions: Fall;Posterior Hip Precaution Booklet Issued: Yes (comment) Precaution Comments: reviewed precautions, gave handout on stair negotiation. Restrictions Weight Bearing Restrictions: Yes RLE Weight Bearing: Weight bearing as tolerated    Mobility  Bed Mobility Overal bed mobility: Needs Assistance Bed Mobility: Supine to Sit;Sit to Supine   Sidelying to sit: Mod assist Supine to sit: Mod assist          Transfers Overall transfer level: Needs assistance Equipment used: Rolling walker (2 wheeled) Transfers: Sit to/from Stand Sit to Stand: Min assist            Ambulation/Gait Ambulation/Gait assistance: Min guard;Min assist Gait Distance (Feet): 25 Feet Assistive device: Rolling walker (2 wheeled) Gait Pattern/deviations: Step-through pattern Gait velocity: decreased   General Gait Details: 128 with gait 25' this pm.  further gait limited by Clinical research associate as anticipated continued increases based on prior session earlier and steadily increasing from baseline  of 111   Stairs             Wheelchair Mobility    Modified Rankin (Stroke Patients Only)       Balance Overall balance assessment: Needs assistance Sitting-balance support: No upper extremity supported;Feet supported Sitting balance-Leahy Scale: Good     Standing balance support: Bilateral upper extremity supported;During functional activity Standing balance-Leahy Scale: Good                              Cognition Arousal/Alertness: Awake/alert Behavior During Therapy: WFL for tasks assessed/performed Overall Cognitive Status: Within Functional Limits for tasks assessed                                        Exercises Total Joint Exercises Ankle Circles/Pumps: AROM;20 reps;Supine Quad Sets: AROM;10 reps;Supine Gluteal Sets: AROM;10 reps;Supine Heel Slides: AROM;Right;10 reps    General Comments        Pertinent Vitals/Pain Pain Assessment: Faces Faces Pain Scale: Hurts a little bit Pain Location: R hip Pain Descriptors / Indicators: Aching;Sharp;Sore Pain Intervention(s): Limited activity within patient's tolerance;Monitored during session;Repositioned    Home Living                      Prior Function            PT  Goals (current goals can now be found in the care plan section) Progress towards PT goals: Not progressing toward goals - comment    Frequency    BID      PT Plan Current plan remains appropriate    Co-evaluation              AM-PAC PT "6 Clicks" Mobility   Outcome Measure  Help needed turning from your back to your side while in a flat bed without using bedrails?: A Little Help needed moving from lying on your back to sitting on the side of a flat bed without using bedrails?: A Little Help needed moving to and from a bed to a chair (including a wheelchair)?: A Little Help needed standing up from a chair using your arms (e.g., wheelchair or bedside chair)?: A Little Help needed to  walk in hospital room?: A Little Help needed climbing 3-5 steps with a railing? : A Little 6 Click Score: 18    End of Session Equipment Utilized During Treatment: Gait belt Activity Tolerance: Treatment limited secondary to medical complications (Comment);Patient limited by fatigue Patient left: in chair;with call bell/phone within reach;with family/visitor present Nurse Communication: Mobility status;Other (comment) PT Visit Diagnosis: Unsteadiness on feet (R26.81);Other abnormalities of gait and mobility (R26.89);Pain Pain - Right/Left: Right Pain - part of body: Hip     Time: 7062-3762 PT Time Calculation (min) (ACUTE ONLY): 13 min  Charges:  $Gait Training: 8-22 mins $Therapeutic Exercise: 8-22 mins                    Danielle Dess, PTA 04/27/21, 2:57 PM , 2:55 PM

## 2021-04-28 NOTE — Progress Notes (Signed)
  Subjective: 3 Days Post-Op Procedure(s) (LRB): RIGHT TOTAL HIP ARTHROPLASTY (Right) Patient reports pain as well-controlled.   Patient is  feeling well and reports a good night sleep. Plan is to go Home after hospital stay. Negative for chest pain and shortness of breath Fever: no Gastrointestinal: negative for nausea and vomiting.  Patient has had a bowel movement.  Objective: Vital signs in last 24 hours: Temp:  [97.4 F (36.3 C)-98.7 F (37.1 C)] 97.4 F (36.3 C) (07/25 0420) Pulse Rate:  [81-122] 81 (07/25 0420) Resp:  [17-20] 17 (07/25 0420) BP: (104-125)/(61-72) 120/63 (07/25 0420) SpO2:  [94 %-98 %] 98 % (07/25 0420)  Intake/Output from previous day:  Intake/Output Summary (Last 24 hours) at 04/28/2021 0704 Last data filed at 04/27/2021 2300 Gross per 24 hour  Intake 240 ml  Output --  Net 240 ml    Intake/Output this shift: No intake/output data recorded.  Labs: Recent Labs    04/26/21 1018 04/27/21 1552  HGB 12.5 11.6*   Recent Labs    04/26/21 1018 04/27/21 1552  WBC 9.1 9.0  RBC 3.95 3.78*  HCT 36.0 34.1*  PLT 337 310   Recent Labs    04/27/21 1552  NA 137  K 3.8  CL 105  CO2 26  BUN 10  CREATININE 0.85  GLUCOSE 115*  CALCIUM 7.9*   No results for input(s): LABPT, INR in the last 72 hours.   EXAM General - Patient is Alert, Appropriate, and Oriented Extremity - Neurovascular intact Dorsiflexion/Plantar flexion intact Compartment soft Dressing/Incision -clean, mild serous drainage noted  Motor Function - intact, moving foot and toes well on exam.    Cardiovascular- Regular rate and rhythm, no murmurs/rubs/gallops Respiratory- Lungs clear to auscultation bilaterally Gastrointestinal- soft, nontender, and active bowel sounds   Assessment/Plan: 3 Days Post-Op Procedure(s) (LRB): RIGHT TOTAL HIP ARTHROPLASTY (Right) Active Problems:   Hx of total hip arthroplasty, right  Estimated body mass index is 30.04 kg/m as calculated from  the following:   Height as of this encounter: 5\' 7"  (1.702 m).   Weight as of this encounter: 87 kg. Advance diet Up with therapy Discharge home with home health pending physical therapy.        DVT Prophylaxis - Lovenox, Ted hose, and foot pumps Weight-Bearing as tolerated to right leg  , PA-C [facility] 04/28/2021, 7:04 AM

## 2021-04-28 NOTE — Progress Notes (Signed)
Physical Therapy Treatment Patient Details Name: Miranda Daniel MRN: [id number] DOB: Aug 27, 1946 Today's Date: 04/28/2021    History of Present Illness 75 y.o. female s/p R THA 04/25/21. PMH significant for GERD and OA.    PT Comments    Pt ready for session.  Stood and is able to complete lap and stair training with min guard +1.  HR does remain elevated with gait up to 130 but O2 remains in high 90's today.  Overall gait quality and tolerance improved and pt hopeful to discharge home today.  Messaged PA in regards to response to therapy session.  Of note, pt is walking in room with assist of husband to bathroom and for ADL care.  She is in recliner but plans to get up to sink for self care.  Offered to assist/provide bath pans etc but they both refuse stating that he has been helping for several days and would be at home.  Education provided regarding safety and overall risk/benefit and they choose to continue with him helping with mobility.   Follow Up Recommendations  Home health PT     Equipment Recommendations  None recommended by PT    Recommendations for Other Services       Precautions / Restrictions Precautions Precautions: Fall;Posterior Hip Precaution Booklet Issued: Yes (comment) Restrictions Weight Bearing Restrictions: Yes RLE Weight Bearing: Weight bearing as tolerated    Mobility  Bed Mobility               General bed mobility comments: in recliner before and after    Transfers Overall transfer level: Needs assistance Equipment used: Rolling walker (2 wheeled) Transfers: Sit to/from Stand Sit to Stand: Supervision            Ambulation/Gait Ambulation/Gait assistance: Min guard;Supervision Gait Distance (Feet): 200 Feet Assistive device: Rolling walker (2 wheeled) Gait Pattern/deviations: Step-through pattern Gait velocity: decreased       Stairs Stairs: Yes Stairs assistance: Min guard Stair Management: Backwards;With  walker Number of Stairs: 2 General stair comments: Husband with good understanding of proper stair negotiation/technique   Wheelchair Mobility    Modified Rankin (Stroke Patients Only)       Balance Overall balance assessment: Needs assistance Sitting-balance support: No upper extremity supported;Feet supported Sitting balance-Leahy Scale: Good     Standing balance support: Bilateral upper extremity supported;During functional activity Standing balance-Leahy Scale: Good                              Cognition Arousal/Alertness: Awake/alert Behavior During Therapy: WFL for tasks assessed/performed Overall Cognitive Status: Within Functional Limits for tasks assessed                                        Exercises      General Comments        Pertinent Vitals/Pain Pain Assessment: Faces Faces Pain Scale: Hurts a little bit Pain Location: R hip Pain Descriptors / Indicators: Aching;Sharp;Sore Pain Intervention(s): Limited activity within patient's tolerance;Monitored during session;Repositioned    Home Living                      Prior Function            PT Goals (current goals can now be found in the care plan section) Progress towards PT goals: Progressing toward goals  Frequency    BID      PT Plan Current plan remains appropriate    Co-evaluation              AM-PAC PT "6 Clicks" Mobility   Outcome Measure  Help needed turning from your back to your side while in a flat bed without using bedrails?: A Little Help needed moving from lying on your back to sitting on the side of a flat bed without using bedrails?: A Little Help needed moving to and from a bed to a chair (including a wheelchair)?: A Little Help needed standing up from a chair using your arms (e.g., wheelchair or bedside chair)?: A Little Help needed to walk in hospital room?: A Little Help needed climbing 3-5 steps with a railing? : A  Little 6 Click Score: 18    End of Session Equipment Utilized During Treatment: Gait belt Activity Tolerance: Treatment limited secondary to medical complications (Comment);Patient limited by fatigue Patient left: in chair;with call bell/phone within reach;with family/visitor present Nurse Communication: Mobility status;Other (comment) PT Visit Diagnosis: Unsteadiness on feet (R26.81);Other abnormalities of gait and mobility (R26.89);Pain Pain - Right/Left: Right Pain - part of body: Hip     Time: 0076-2263 PT Time Calculation (min) (ACUTE ONLY): 14 min  Charges:  $Gait Training: 8-22 mins                    Danielle Dess, PTA 04/28/21, 8:45 AM

## 2021-04-28 NOTE — Treatment Plan (Signed)
Patient discharged home via personal vehicle. IV removed. VSS. Home health set up per case management. Discharge went over with patient and family with opportunity to ask questions. All belongings sent with patient. Pt wheeled down to vehicle by Rolly Salter, Charity fundraiser.

## 2021-04-29 LAB — SURGICAL PATHOLOGY

## 2021-07-03 ENCOUNTER — Other Ambulatory Visit: Payer: Self-pay

## 2021-07-03 ENCOUNTER — Encounter
Admission: RE | Admit: 2021-07-03 | Discharge: 2021-07-03 | Disposition: A | Discharge status: Home / Self Care | Payer: Medicare Other | Source: Ambulatory Visit | Attending: Orthopedic Surgery | Admitting: Orthopedic Surgery

## 2021-07-03 DIAGNOSIS — Z20822 Contact with and (suspected) exposure to covid-19: Secondary | ICD-10-CM | POA: Diagnosis not present

## 2021-07-03 DIAGNOSIS — Z01812 Encounter for preprocedural laboratory examination: Secondary | ICD-10-CM | POA: Insufficient documentation

## 2021-07-03 LAB — APTT: aPTT: 28 seconds (ref 24–36)

## 2021-07-03 LAB — CBC WITH DIFFERENTIAL/PLATELET
Abs Immature Granulocytes: 0.02 10*3/uL (ref 0.00–0.07)
Basophils Absolute: 0.1 10*3/uL (ref 0.0–0.1)
Basophils Relative: 1 %
Eosinophils Absolute: 0.2 10*3/uL (ref 0.0–0.5)
Eosinophils Relative: 2 %
HCT: 42.1 % (ref 36.0–46.0)
Hemoglobin: 14.6 g/dL (ref 12.0–15.0)
Immature Granulocytes: 0 %
Lymphocytes Relative: 28 %
Lymphs Abs: 2.2 10*3/uL (ref 0.7–4.0)
MCH: 30.2 pg (ref 26.0–34.0)
MCHC: 34.7 g/dL (ref 30.0–36.0)
MCV: 87.2 fL (ref 80.0–100.0)
Monocytes Absolute: 0.5 10*3/uL (ref 0.1–1.0)
Monocytes Relative: 6 %
Neutro Abs: 4.9 10*3/uL (ref 1.7–7.7)
Neutrophils Relative %: 63 %
Platelets: 416 10*3/uL — ABNORMAL HIGH (ref 150–400)
RBC: 4.83 MIL/uL (ref 3.87–5.11)
RDW: 12.8 % (ref 11.5–15.5)
WBC: 7.9 10*3/uL (ref 4.0–10.5)
nRBC: 0 % (ref 0.0–0.2)

## 2021-07-03 LAB — COMPREHENSIVE METABOLIC PANEL
ALT: 25 U/L (ref 0–44)
AST: 20 U/L (ref 15–41)
Albumin: 4.4 g/dL (ref 3.5–5.0)
Alkaline Phosphatase: 85 U/L (ref 38–126)
Anion gap: 10 (ref 5–15)
BUN: 13 mg/dL (ref 8–23)
CO2: 28 mmol/L (ref 22–32)
Calcium: 9.4 mg/dL (ref 8.9–10.3)
Chloride: 99 mmol/L (ref 98–111)
Creatinine, Ser: 1.02 mg/dL — ABNORMAL HIGH (ref 0.44–1.00)
GFR, Estimated: 58 mL/min — ABNORMAL LOW (ref >60)
Glucose, Bld: 95 mg/dL (ref 70–99)
Potassium: 3.8 mmol/L (ref 3.5–5.1)
Sodium: 137 mmol/L (ref 135–145)
Total Bilirubin: 1 mg/dL (ref 0.3–1.2)
Total Protein: 7.6 g/dL (ref 6.5–8.1)

## 2021-07-03 LAB — URINALYSIS, ROUTINE W REFLEX MICROSCOPIC
Bilirubin Urine: NEGATIVE
Glucose, UA: NEGATIVE mg/dL
Hgb urine dipstick: NEGATIVE
Ketones, ur: NEGATIVE mg/dL
Leukocytes,Ua: NEGATIVE
Nitrite: NEGATIVE
Protein, ur: NEGATIVE mg/dL
Specific Gravity, Urine: 1.01 (ref 1.005–1.030)
pH: 5.5 (ref 5.0–8.0)

## 2021-07-03 LAB — TYPE AND SCREEN
ABO/RH(D): O POS
Antibody Screen: NEGATIVE

## 2021-07-03 LAB — SURGICAL PCR SCREEN
MRSA, PCR: NEGATIVE
Staphylococcus aureus: POSITIVE — AB

## 2021-07-03 LAB — C-REACTIVE PROTEIN: CRP: 0.5 mg/dL (ref <1.0)

## 2021-07-03 LAB — PROTIME-INR
INR: 1 (ref 0.8–1.2)
Prothrombin Time: 12.7 seconds (ref 11.4–15.2)

## 2021-07-03 LAB — SEDIMENTATION RATE: Sed Rate: 7 mm/hr (ref 0–30)

## 2021-07-03 NOTE — Patient Instructions (Signed)
Your procedure is scheduled on: 07/07/2021 Report to the Registration Desk on the 1st floor of the [facility]. To find out your arrival time, please call [phone] between 1PM - 3PM on: 07/04/2021  REMEMBER: Instructions that are not followed completely may result in serious medical risk, up to and including death; or upon the discretion of your surgeon and anesthesiologist your surgery may need to be rescheduled.  Do not eat food after midnight the night before surgery.  No gum chewing, lozengers or hard candies.  You may however, drink CLEAR liquids up to 2 hours before you are scheduled to arrive for your surgery. Do not drink anything within 2 hours of your scheduled arrival time.  Clear liquids include: - water  - apple juice without pulp - gatorade - black coffee or tea (Do NOT add milk or creamers to the coffee or tea) Do NOT drink anything that is not on this list.  In addition, your doctor has ordered for you to drink the provided  Ensure Pre-Surgery Clear Carbohydrate Drink  Drinking this carbohydrate drink up to two hours before surgery helps to reduce insulin resistance and improve patient outcomes. Please complete drinking 2 hours prior to scheduled arrival time.  TAKE THESE MEDICATIONS THE MORNING OF SURGERY WITH A SIP OF WATER: - esomeprazole (NEXIUM) 20 MG (take one the night before and one on the morning of surgery - helps to prevent nausea after surgery.)  One week prior to surgery: Stop Anti-inflammatories (NSAIDS) such as Advil, Aleve, Ibuprofen, Motrin, Naproxen, Naprosyn and Aspirin based products such as Excedrin, Goodys Powder, BC Powder. You may however, continue to take Tylenol if needed for pain up until the day of surgery. Stop ANY OVER THE COUNTER vitamins and supplements until after surgery.  No Alcohol for 24 hours before or after surgery.  No Smoking including e-cigarettes for 24 hours prior to surgery.  No chewable tobacco products for at least  6 hours prior to surgery.  No nicotine patches on the day of surgery.  Do not use any "recreational" drugs for at least a week prior to your surgery.  Please be advised that the combination of cocaine and anesthesia may have negative outcomes, up to and including death. If you test positive for cocaine, your surgery will be cancelled.  On the morning of surgery brush your teeth with toothpaste and water, you may rinse your mouth with mouthwash if you wish. Do not swallow any toothpaste or mouthwash.  Use CHG Soap or wipes as directed on instruction sheet.  Do not wear jewelry, make-up, hairpins, clips or nail polish.  Do not wear lotions, powders, or perfumes.   Do not shave body from the neck down 48 hours prior to surgery just in case you cut yourself which could leave a site for infection.  Also, freshly shaved skin may become irritated if using the CHG soap.  Contact lenses, hearing aids and dentures may not be worn into surgery.  Do not bring valuables to the hospital. [facility] is not responsible for any missing/lost belongings or valuables.   Notify your doctor if there is any change in your medical condition (cold, fever, infection).  Wear comfortable clothing (specific to your surgery type) to the hospital.  After surgery, you can help prevent lung complications by doing breathing exercises.  Take deep breaths and cough every 1-2 hours. Your doctor may order a device called an Incentive Spirometer to help you take deep breaths. When coughing or sneezing, hold a  pillow firmly against your incision with both hands. This is called "splinting." Doing this helps protect your incision. It also decreases belly discomfort.  If you are being admitted to the hospital overnight, leave your suitcase in the car. After surgery it may be brought to your room.  Please call the Pre-admissions Testing Dept. at [phone] if you have any questions about these instructions.  Surgery  Visitation Policy:  Patients undergoing a surgery or procedure may have one family member or support person with them as long as that person is not COVID-19 positive or experiencing its symptoms.  That person may remain in the waiting area during the procedure and may rotate out with other people.  Inpatient Visitation:    Visiting hours are 7 a.m. to 8 p.m. Up to two visitors ages 16+ are allowed at one time in a patient room. The visitors may rotate out with other people during the day. Visitors must check out when they leave, or other visitors will not be allowed. One designated support person may remain overnight. The visitor must pass COVID-19 screenings, use hand sanitizer when entering and exiting the patient's room and wear a mask at all times, including in the patient's room. Patients must also wear a mask when staff or their visitor are in the room. Masking is required regardless of vaccination status.

## 2021-07-03 NOTE — Patient Instructions (Signed)
Your procedure is scheduled on: 07/07/2021 Report to the Registration Desk on the 1st floor of the [facility]. To find out your arrival time, please call [phone] between 1PM - 3PM on: 07/04/2021  REMEMBER: Instructions that are not followed completely may result in serious medical risk, up to and including death; or upon the discretion of your surgeon and anesthesiologist your surgery may need to be rescheduled.  Do not eat food after midnight the night before surgery.  No gum chewing, lozengers or hard candies.  You may however, drink CLEAR liquids up to 2 hours before you are scheduled to arrive for your surgery. Do not drink anything within 2 hours of your scheduled arrival time.  Clear liquids include: - water  - apple juice without pulp - gatorade - black coffee or tea (Do NOT add milk or creamers to the coffee or tea) Do NOT drink anything that is not on this list.  In addition, your doctor has ordered for you to drink the provided  Ensure Pre-Surgery Clear Carbohydrate Drink  Drinking this carbohydrate drink up to two hours before surgery helps to reduce insulin resistance and improve patient outcomes. Please complete drinking 2 hours prior to scheduled arrival time.  TAKE THESE MEDICATIONS THE MORNING OF SURGERY WITH A SIP OF WATER: - esomeprazole (NEXIUM) 20 MG capsule (take one the night before and one on the morning of surgery - helps to prevent nausea after surgery.)   One week prior to surgery: Stop Anti-inflammatories (NSAIDS) such as Advil, Aleve, Ibuprofen, Motrin, Naproxen, Naprosyn and Aspirin based products such as Excedrin, Goodys Powder, BC Powder. You may however, continue to take Tylenol if needed for pain up until the day of surgery. Stop ANY OVER THE COUNTER vitamins and supplements until after surgery.   No Alcohol for 24 hours before or after surgery.  No Smoking including e-cigarettes for 24 hours prior to surgery.  No chewable tobacco products  for at least 6 hours prior to surgery.  No nicotine patches on the day of surgery.  Do not use any "recreational" drugs for at least a week prior to your surgery.  Please be advised that the combination of cocaine and anesthesia may have negative outcomes, up to and including death. If you test positive for cocaine, your surgery will be cancelled.  On the morning of surgery brush your teeth with toothpaste and water, you may rinse your mouth with mouthwash if you wish. Do not swallow any toothpaste or mouthwash.  Use CHG Soap or wipes as directed on instruction sheet.  Do not wear jewelry, make-up, hairpins, clips or nail polish.  Do not wear lotions, powders, or perfumes.   Do not shave body from the neck down 48 hours prior to surgery just in case you cut yourself which could leave a site for infection.  Also, freshly shaved skin may become irritated if using the CHG soap.  Contact lenses, hearing aids and dentures may not be worn into surgery.  Do not bring valuables to the hospital. [facility] is not responsible for any missing/lost belongings or valuables.   Fleets enema or bowel prep as directed.  Notify your doctor if there is any change in your medical condition (cold, fever, infection).  Wear comfortable clothing (specific to your surgery type) to the hospital.  After surgery, you can help prevent lung complications by doing breathing exercises.  Take deep breaths and cough every 1-2 hours. Your doctor may order a device called an Facilities manager to  help you take deep breaths. When coughing or sneezing, hold a pillow firmly against your incision with both hands. This is called "splinting." Doing this helps protect your incision. It also decreases belly discomfort.  If you are being admitted to the hospital overnight, leave your suitcase in the car. After surgery it may be brought to your room.   Please call the Pre-admissions Testing Dept. at [phone] if you  have any questions about these instructions.  Surgery Visitation Policy:  Patients undergoing a surgery or procedure may have one family member or support person with them as long as that person is not COVID-19 positive or experiencing its symptoms.  That person may remain in the waiting area during the procedure and may rotate out with other people.  Inpatient Visitation:    Visiting hours are 7 a.m. to 8 p.m. Up to two visitors ages 16+ are allowed at one time in a patient room. The visitors may rotate out with other people during the day. Visitors must check out when they leave, or other visitors will not be allowed. One designated support person may remain overnight. The visitor must pass COVID-19 screenings, use hand sanitizer when entering and exiting the patient's room and wear a mask at all times, including in the patient's room. Patients must also wear a mask when staff or their visitor are in the room. Masking is required regardless of vaccination status.

## 2021-07-04 LAB — SARS CORONAVIRUS 2 (TAT 6-24 HRS): SARS Coronavirus 2: NEGATIVE

## 2021-07-06 ENCOUNTER — Encounter: Payer: Self-pay | Admitting: Orthopedic Surgery

## 2021-07-06 NOTE — H&P (Signed)
ORTHOPAEDIC HISTORY & PHYSICAL Miranda Daniel, Miranda Daniel., MD  Chief Complaint: Chief Complaint   Hip Pain  Left hip degenerative arthrosis   Reason for Visit: The patient is a 75 y.o. female who returns today with her husband for reevaluation of her right hip. She has a 1-1/2 year history of bilateral hip and groin pain. She underwent right total hip arthroplasty on 04/25/2021 with excellent relief of the right hip pain.The left  hip pain has continued to increase in severity. The pain is worse with weight bearing. She reports some progressive decrease in her hip range of motion and has difficulty with donning and doffing her shoes and stockings. The left hip pain limits her ability to ambulate long distances. The patient has not appreciated any significant improvement despite NSAIDs, Tylenol, and activity modification. She is not using any ambulatory aids. The patient states that the hip pain has progressed to the point that it is significantly interfering with her activities of daily living.  Medications: Current Outpatient Medications  Medication Sig Dispense Refill   acetaminophen (TYLENOL) 500 MG tablet Take 1,000 mg by mouth every 6 (six) hours as needed   esomeprazole (NEXIUM) 20 MG DR capsule Take 20 mg by mouth once daily   ibuprofen (MOTRIN) 200 MG tablet Take 400 mg by mouth once daily as needed for Pain Alternates with aleve every other day   naproxen sodium (ALEVE) 220 MG tablet Take 220 mg by mouth once daily as needed for Pain Alternates with ibuprofen every other day   No current facility-administered medications for this visit.   Allergies: Allergies  Allergen Reactions   Amoxicillin-Pot Clavulanate Diarrhea  Upset stomach   Past Medical History: Past Medical History:  Diagnosis Date   Chicken pox   GERD (gastroesophageal reflux disease)   Osteoarthritis   Past Surgical History: Past Surgical History:  Procedure Laterality Date   APPENDECTOMY   COLONOSCOPY  11/14/2010  Hyperplastic Polyp: CBF 11/2020   HYSTERECTOMY   Right total hip arthroplasty 04/25/2021  Dr Ernest Pine   TONSILLECTOMY   Social History: Social History   Socioeconomic History   Marital status: Married  Spouse name: Ronnie   Number of children: 2   Years of education: 12   Highest education level: High school graduate  Occupational History   Occupation: Retired- IT consultant  Tobacco Use   Smoking status: Never Smoker   Smokeless tobacco: Never Used  Building services engineer Use: Never used  Substance and Sexual Activity   Alcohol use: Yes  Alcohol/week: 4.0 standard drinks  Types: 4 Glasses of wine per week   Drug use: No   Sexual activity: Yes  Partners: Male   Family History: Family History  Problem Relation Age of Onset   Dementia Mother   No Known Problems Father   Melanoma Sister   Dementia Maternal Grandmother   Dementia Maternal Grandfather   Review of Systems: A comprehensive 14 point ROS was performed, reviewed, and the pertinent orthopaedic findings are documented in the HPI.  Exam General:  Well-developed, well-nourished female seen in no acute distress.  Antalgic gait without evidence of significant abductor lurch.  HEENT:  Atraumatic, normocephalic. Pupils are equal and reactive to light. Extraocular motion is intact. Sclera are clear. Oropharynx is clear with moist mucosa.  Neck:  Supple, nontender, and with good ROM. No thyromegaly, adenopathy, JVD, or carotid bruits.  Lungs:  Clear to auscultation bilaterally.  Cardiovascular:  Regular rate and rhythm. Normal S1, S2. No murmur . No appreciable  gallops or rubs. Peripheral pulses are palpable. No lower extremity edema. Homan`s test is negative.  Abdomen:  Soft, nontender, nondistended. Bowel sounds are present.  Spine: Alignment: No gross scoliosis. Normal lumbar lordosis. Sacroiliac joints: Nontender to palpation Patrick`s test: Negative Flip test: Negative Tenderness:  None Paraspinous spasm: None Range of motion: Good range of motion with both flexion and extension  Extremities: Good strength, stability, and range of motion of the upper extremities. Good range of motion of the knees and ankles.   Right Hip:  Pelvic tilt: Negative Limb lengths: Equal with the patient standing. Soft tissue swelling: Negative Erythema: Negative Crepitance: Negative Tenderness: Greater trochanter is nontender to palpation. No pain is elicited by axial compression or extremes of rotation. Atrophy: No atrophy. Good hip flexor and abductor strength. Incision: Well healed Range of Motion: Good active range of motion.  Left Hip: Soft tissue swelling: Negative Erythema: Negative Crepitance: Positive Tenderness: Greater trochanter is nontender to palpation. Moderate pain is elicited by axial compression or extremes of rotation. Atrophy: No atrophy. Fair to good hip flexor and abductor strength. Range of Motion: EXT/FLEX: 0/0/100 ADD/ABD: 20/0/20 IR/ER: 10/0/10  Vascular: Peripheral pulses are palpable. Good capillary refill. No gross pretibial or ankle edema. Homans test is negative.  Neurologic:  Awake, alert, and oriented.  Sensory function is intact to pinprick and light touch.  Motor strength is judged to be 5/5.  Motor coordination is grossly within normal limits.  No apparent clonus. No tremor.   Radiographs: Radiographs of the left hip demonstrate significant narrowing of the cartilage space with slight flattening of the femoral head. Subchondral sclerosis is noted. Osteophyte formation is present.   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 She is doing extremely well with regard to the right hip. Conservative treatment options were reviewed with the patient. We discussed the risks and benefits of surgical intervention. The usual perioperative course was  also discussed in detail. The patient expressed understanding of the risks and benefits of surgical intervention and would like to proceed with plans for left total hip arthroplasty.   SPECIAL EQUIPMENT: None SPECIAL STUDIES: None ACTIVITIES:  As tolerated. WORK STATUS: Not applicable. THERAPY: Home exercise program as reviewed with the patient. MEDICATIONS: Requested Prescriptions   No prescriptions requested or ordered in this encounter   FOLLOW-UP: Return for postop visit on 08/21/2021 at 10:45 am  Canisha Issac P. Angie Fava., M.D.

## 2021-07-06 NOTE — Discharge Instructions (Signed)
Instructions after Total Hip Replacement     Miranda Daniel [name], [name], M.D.     Dept. of Orthopaedics & Sports Medicine  [facility]  [address]  [place], [place]  [address]  Phone: [phone]   Fax: [phone]    DIET: . Drink plenty of non-alcoholic fluids. . Resume your normal diet. Include foods high in fiber.  ACTIVITY:  . You may use crutches or a walker with weight-bearing as tolerated, unless instructed otherwise. . You may be weaned off of the walker or crutches by your Physical Therapist.  . Do NOT reach below the level of your knees or cross your legs until allowed.    . Continue doing gentle exercises. Exercising will reduce the pain and swelling, increase motion, and prevent muscle weakness.   . Please continue to use the TED compression stockings for 6 weeks. You may remove the stockings at night, but should reapply them in the morning. . Do not drive or operate any equipment until instructed.  WOUND CARE:  . Continue to use ice packs periodically to reduce pain and swelling. . Keep the incision clean and dry. . You may bathe or shower after the staples are removed at the first office visit following surgery.  MEDICATIONS: . You may resume your regular medications. . Please take the pain medication as prescribed on the medication. . Do not take pain medication on an empty stomach. . You have been given a prescription for a blood thinner to prevent blood clots. Please take the medication as instructed. (NOTE: After completing a 2 week course of Lovenox, take one Enteric-coated aspirin once a day.) . Pain medications and iron supplements can cause constipation. Use a stool softener (Senokot or Colace) on a daily basis and a laxative (dulcolax or miralax) as needed. . Do not drive or drink alcoholic beverages when taking pain medications.  CALL THE OFFICE FOR: . Temperature above 101 degrees . Excessive bleeding or drainage on the dressing. . Excessive  swelling, coldness, or paleness of the toes. . Persistent nausea and vomiting.  FOLLOW-UP:  . You should have an appointment to return to the office in 6 weeks after surgery. . Arrangements have been made for continuation of Physical Therapy (either home therapy or outpatient therapy).     [facility] Department Directory         [email or web address]       [email or web address]          Cardiology  Appointments: [place] - [phone] [place] - [phone]  Endocrinology  Appointments: [place] - [phone] [place] - [phone]  Gastroenterology  Appointments: [place] - [phone] [place] - [phone]        General Surgery   Appointments: [place] - [phone]  Internal Medicine/Family Medicine  Appointments: [place] - [phone] [place] - [phone] [place] - [phone]  Metabolic and Weigh Loss Surgery  Appointments: [place] - [phone]        Neurology  Appointments: [place] - [phone] [place] - [phone]  Neurosurgery  Appointments: [place] - [phone]  Obstetrics & Gynecology  Appointments: [place] - [phone] [place] - [phone]        Pediatrics  Appointments: [place] - [phone] [place] - [phone]  Physiatry  Appointments: [place] -[phone]  Physical Therapy  Appointments: [place] - [phone] [place] - [phone]        Podiatry  Appointments: [place] - [phone] [place] - [phone]  Pulmonology  Appointments: [place] - [phone]  Rheumatology  Appointments: [place] - [phone]        [place] Location: [facility]  [address] [place], [place]  [address]  [place] Location: [facility] [address] [place], [place]  [address]  [place] Location: [facility] [address] [place], [place]  [address]    

## 2021-07-07 ENCOUNTER — Encounter
Admission: RE | Disposition: A | Discharge status: Home / Self Care | Payer: Self-pay | Source: Ambulatory Visit | Attending: Orthopedic Surgery

## 2021-07-07 ENCOUNTER — Observation Stay: Payer: Medicare Other

## 2021-07-07 ENCOUNTER — Ambulatory Visit: Payer: Medicare Other | Admitting: Urgent Care

## 2021-07-07 ENCOUNTER — Other Ambulatory Visit: Payer: Self-pay

## 2021-07-07 ENCOUNTER — Encounter: Payer: Self-pay | Admitting: Orthopedic Surgery

## 2021-07-07 ENCOUNTER — Observation Stay
Admission: RE | Admit: 2021-07-07 | Discharge: 2021-07-09 | Disposition: A | Discharge status: Home / Self Care | Payer: Medicare Other | Source: Ambulatory Visit | Attending: Orthopedic Surgery | Admitting: Orthopedic Surgery

## 2021-07-07 DIAGNOSIS — Z96641 Presence of right artificial hip joint: Secondary | ICD-10-CM | POA: Diagnosis not present

## 2021-07-07 DIAGNOSIS — M1612 Unilateral primary osteoarthritis, left hip: Principal | ICD-10-CM | POA: Insufficient documentation

## 2021-07-07 DIAGNOSIS — Z96649 Presence of unspecified artificial hip joint: Secondary | ICD-10-CM

## 2021-07-07 DIAGNOSIS — Z96642 Presence of left artificial hip joint: Secondary | ICD-10-CM

## 2021-07-07 HISTORY — PX: TOTAL HIP ARTHROPLASTY: SHX124

## 2021-07-07 SURGERY — ARTHROPLASTY, HIP, TOTAL,POSTERIOR APPROACH
Anesthesia: Spinal | Site: Hip | Laterality: Left

## 2021-07-07 MED ORDER — ENSURE PRE-SURGERY PO LIQD
296.0000 mL | Freq: Once | ORAL | Status: AC
  Administered 2021-07-07: 296 mL via ORAL
  Filled 2021-07-07: qty 296

## 2021-07-07 MED ORDER — PROPOFOL 1000 MG/100ML IV EMUL
INTRAVENOUS | Status: AC
  Filled 2021-07-07: qty 100

## 2021-07-07 MED ORDER — ONDANSETRON HCL 4 MG PO TABS: 4.0000 mg | ORAL_TABLET | Freq: Four times a day (QID) | ORAL | Status: DC | PRN

## 2021-07-07 MED ORDER — TRANEXAMIC ACID-NACL 1000-0.7 MG/100ML-% IV SOLN
INTRAVENOUS | Status: AC
  Filled 2021-07-07: qty 100

## 2021-07-07 MED ORDER — ONDANSETRON HCL 4 MG/2ML IJ SOLN: 4.0000 mg | Freq: Four times a day (QID) | INTRAMUSCULAR | Status: DC | PRN

## 2021-07-07 MED ORDER — NEOMYCIN-POLYMYXIN B GU 40-200000 IR SOLN
Status: AC
  Filled 2021-07-07: qty 20

## 2021-07-07 MED ORDER — PROPOFOL 10 MG/ML IV BOLUS
INTRAVENOUS | Status: AC
  Filled 2021-07-07: qty 20

## 2021-07-07 MED ORDER — MIDAZOLAM HCL 2 MG/2ML IJ SOLN
INTRAMUSCULAR | Status: AC
  Filled 2021-07-07: qty 2

## 2021-07-07 MED ORDER — TRAMADOL HCL 50 MG PO TABS
50.0000 mg | ORAL_TABLET | ORAL | Status: DC | PRN
  Administered 2021-07-08 – 2021-07-09 (×3): 50 mg via ORAL
  Filled 2021-07-07 (×3): qty 1

## 2021-07-07 MED ORDER — ALUM & MAG HYDROXIDE-SIMETH 200-200-20 MG/5ML PO SUSP: 30.0000 mL | ORAL | Status: DC | PRN

## 2021-07-07 MED ORDER — DEXAMETHASONE SODIUM PHOSPHATE 10 MG/ML IJ SOLN
INTRAMUSCULAR | Status: AC
  Administered 2021-07-07: 8 mg via INTRAVENOUS
  Filled 2021-07-07: qty 1

## 2021-07-07 MED ORDER — ORAL CARE MOUTH RINSE: 15.0000 mL | Freq: Once | OROMUCOSAL | Status: AC

## 2021-07-07 MED ORDER — CEFAZOLIN SODIUM-DEXTROSE 2-4 GM/100ML-% IV SOLN
2.0000 g | INTRAVENOUS | Status: AC
  Administered 2021-07-07: 2 g via INTRAVENOUS

## 2021-07-07 MED ORDER — PROPOFOL 500 MG/50ML IV EMUL
INTRAVENOUS | Status: DC | PRN
  Administered 2021-07-07: 80 ug/kg/min via INTRAVENOUS

## 2021-07-07 MED ORDER — MENTHOL 3 MG MT LOZG
1.0000 | LOZENGE | OROMUCOSAL | Status: DC | PRN
  Filled 2021-07-07: qty 9

## 2021-07-07 MED ORDER — HYDROMORPHONE HCL 1 MG/ML IJ SOLN
0.5000 mg | INTRAMUSCULAR | Status: DC | PRN
Start: 2021-07-07 — End: 2021-07-09
  Administered 2021-07-07: 1 mg via INTRAVENOUS
  Filled 2021-07-07: qty 1

## 2021-07-07 MED ORDER — SODIUM CHLORIDE 0.9 % IR SOLN
Status: DC | PRN
  Administered 2021-07-07: 3000 mL

## 2021-07-07 MED ORDER — GABAPENTIN 300 MG PO CAPS
ORAL_CAPSULE | ORAL | Status: AC
  Administered 2021-07-07: 300 mg via ORAL
  Filled 2021-07-07: qty 1

## 2021-07-07 MED ORDER — SODIUM CHLORIDE 0.9 % IV SOLN
INTRAVENOUS | Status: DC | PRN
  Administered 2021-07-07: 30 ug/min via INTRAVENOUS

## 2021-07-07 MED ORDER — EPHEDRINE SULFATE 50 MG/ML IJ SOLN
INTRAMUSCULAR | Status: DC | PRN
  Administered 2021-07-07: 5 mg via INTRAVENOUS
  Administered 2021-07-07: 10 mg via INTRAVENOUS

## 2021-07-07 MED ORDER — PANTOPRAZOLE SODIUM 40 MG PO TBEC
40.0000 mg | DELAYED_RELEASE_TABLET | Freq: Two times a day (BID) | ORAL | Status: DC
  Administered 2021-07-07 – 2021-07-09 (×5): 40 mg via ORAL
  Filled 2021-07-07 (×5): qty 1

## 2021-07-07 MED ORDER — MAGNESIUM HYDROXIDE 400 MG/5ML PO SUSP
30.0000 mL | Freq: Every day | ORAL | Status: DC
  Administered 2021-07-07 – 2021-07-09 (×3): 30 mL via ORAL
  Filled 2021-07-07 (×3): qty 30

## 2021-07-07 MED ORDER — PHENYLEPHRINE HCL (PRESSORS) 10 MG/ML IV SOLN
INTRAVENOUS | Status: AC
  Filled 2021-07-07: qty 1

## 2021-07-07 MED ORDER — BUPIVACAINE HCL (PF) 0.5 % IJ SOLN
INTRAMUSCULAR | Status: DC | PRN
  Administered 2021-07-07: 3 mL

## 2021-07-07 MED ORDER — CHLORHEXIDINE GLUCONATE 4 % EX LIQD: 60.0000 mL | Freq: Once | CUTANEOUS | Status: DC

## 2021-07-07 MED ORDER — ACETAMINOPHEN 10 MG/ML IV SOLN
INTRAVENOUS | Status: DC | PRN
  Administered 2021-07-07: 1000 mg via INTRAVENOUS

## 2021-07-07 MED ORDER — CEFAZOLIN SODIUM-DEXTROSE 2-4 GM/100ML-% IV SOLN
2.0000 g | Freq: Four times a day (QID) | INTRAVENOUS | Status: AC
Start: 2021-07-07 — End: 2021-07-07
  Administered 2021-07-07 (×2): 2 g via INTRAVENOUS
  Filled 2021-07-07 (×2): qty 100

## 2021-07-07 MED ORDER — 0.9 % SODIUM CHLORIDE (POUR BTL) OPTIME
TOPICAL | Status: DC | PRN
  Administered 2021-07-07: 50 mL

## 2021-07-07 MED ORDER — BISACODYL 10 MG RE SUPP
10.0000 mg | Freq: Every day | RECTAL | Status: DC | PRN
  Administered 2021-07-09: 10 mg via RECTAL
  Filled 2021-07-07: qty 1

## 2021-07-07 MED ORDER — CHLORHEXIDINE GLUCONATE 0.12 % MT SOLN
OROMUCOSAL | Status: AC
  Administered 2021-07-07: 15 mL via OROMUCOSAL
  Filled 2021-07-07: qty 15

## 2021-07-07 MED ORDER — OXYCODONE HCL 5 MG PO TABS
10.0000 mg | ORAL_TABLET | ORAL | Status: DC | PRN
Start: 2021-07-07 — End: 2021-07-08
  Administered 2021-07-07 (×2): 10 mg via ORAL
  Filled 2021-07-07 (×2): qty 2

## 2021-07-07 MED ORDER — SENNOSIDES-DOCUSATE SODIUM 8.6-50 MG PO TABS
1.0000 | ORAL_TABLET | Freq: Two times a day (BID) | ORAL | Status: DC
  Administered 2021-07-07 – 2021-07-09 (×5): 1 via ORAL
  Filled 2021-07-07 (×5): qty 1

## 2021-07-07 MED ORDER — DEXAMETHASONE SODIUM PHOSPHATE 10 MG/ML IJ SOLN: 8.0000 mg | Freq: Once | INTRAMUSCULAR | Status: AC

## 2021-07-07 MED ORDER — ACETAMINOPHEN 325 MG PO TABS
325.0000 mg | ORAL_TABLET | Freq: Four times a day (QID) | ORAL | Status: DC | PRN
  Administered 2021-07-08: 325 mg via ORAL
  Administered 2021-07-09 (×2): 650 mg via ORAL
  Filled 2021-07-07: qty 1
  Filled 2021-07-07 (×2): qty 2

## 2021-07-07 MED ORDER — ENOXAPARIN SODIUM 30 MG/0.3ML IJ SOSY
30.0000 mg | PREFILLED_SYRINGE | Freq: Two times a day (BID) | INTRAMUSCULAR | Status: DC
  Administered 2021-07-08 – 2021-07-09 (×3): 30 mg via SUBCUTANEOUS
  Filled 2021-07-07 (×3): qty 0.3

## 2021-07-07 MED ORDER — DIPHENHYDRAMINE HCL 12.5 MG/5ML PO ELIX: 12.5000 mg | ORAL_SOLUTION | ORAL | Status: DC | PRN

## 2021-07-07 MED ORDER — TRANEXAMIC ACID-NACL 1000-0.7 MG/100ML-% IV SOLN
INTRAVENOUS | Status: AC
  Administered 2021-07-07: 1000 mg via INTRAVENOUS
  Filled 2021-07-07: qty 100

## 2021-07-07 MED ORDER — CELECOXIB 200 MG PO CAPS
200.0000 mg | ORAL_CAPSULE | Freq: Two times a day (BID) | ORAL | Status: DC
  Administered 2021-07-07 – 2021-07-09 (×4): 200 mg via ORAL
  Filled 2021-07-07 (×4): qty 1

## 2021-07-07 MED ORDER — FENTANYL CITRATE (PF) 100 MCG/2ML IJ SOLN
INTRAMUSCULAR | Status: DC | PRN
  Administered 2021-07-07 (×2): 50 ug via INTRAVENOUS

## 2021-07-07 MED ORDER — TRANEXAMIC ACID-NACL 1000-0.7 MG/100ML-% IV SOLN
1000.0000 mg | INTRAVENOUS | Status: AC
  Administered 2021-07-07: 1000 mg via INTRAVENOUS

## 2021-07-07 MED ORDER — OXYCODONE HCL 5 MG PO TABS
5.0000 mg | ORAL_TABLET | ORAL | Status: DC | PRN
  Administered 2021-07-08 – 2021-07-09 (×3): 5 mg via ORAL
  Filled 2021-07-07 (×3): qty 1

## 2021-07-07 MED ORDER — ACETAMINOPHEN 10 MG/ML IV SOLN
1000.0000 mg | Freq: Four times a day (QID) | INTRAVENOUS | Status: AC
  Administered 2021-07-07 – 2021-07-08 (×4): 1000 mg via INTRAVENOUS
  Filled 2021-07-07 (×4): qty 100

## 2021-07-07 MED ORDER — SURGIRINSE WOUND IRRIGATION SYSTEM - OPTIME
TOPICAL | Status: DC | PRN
  Administered 2021-07-07: 401 mL via TOPICAL

## 2021-07-07 MED ORDER — CEFAZOLIN SODIUM-DEXTROSE 2-4 GM/100ML-% IV SOLN
INTRAVENOUS | Status: AC
  Filled 2021-07-07: qty 100

## 2021-07-07 MED ORDER — PHENOL 1.4 % MT LIQD
1.0000 | OROMUCOSAL | Status: DC | PRN
  Filled 2021-07-07: qty 177

## 2021-07-07 MED ORDER — GLYCOPYRROLATE 0.2 MG/ML IJ SOLN
INTRAMUSCULAR | Status: DC | PRN
  Administered 2021-07-07: .2 mg via INTRAVENOUS

## 2021-07-07 MED ORDER — DEXMEDETOMIDINE (PRECEDEX) IN NS 20 MCG/5ML (4 MCG/ML) IV SYRINGE
PREFILLED_SYRINGE | INTRAVENOUS | Status: DC | PRN
  Administered 2021-07-07: 8 ug via INTRAVENOUS
  Administered 2021-07-07: 4 ug via INTRAVENOUS
  Administered 2021-07-07: 8 ug via INTRAVENOUS

## 2021-07-07 MED ORDER — FENTANYL CITRATE (PF) 100 MCG/2ML IJ SOLN
INTRAMUSCULAR | Status: AC
  Filled 2021-07-07: qty 2

## 2021-07-07 MED ORDER — FERROUS SULFATE 325 (65 FE) MG PO TABS
325.0000 mg | ORAL_TABLET | Freq: Two times a day (BID) | ORAL | Status: DC
  Administered 2021-07-07 – 2021-07-09 (×4): 325 mg via ORAL
  Filled 2021-07-07 (×4): qty 1

## 2021-07-07 MED ORDER — NEOMYCIN-POLYMYXIN B GU 40-200000 IR SOLN
Status: DC | PRN
  Administered 2021-07-07: 12 mL

## 2021-07-07 MED ORDER — FLEET ENEMA 7-19 GM/118ML RE ENEM
1.0000 | ENEMA | Freq: Once | RECTAL | Status: DC | PRN
Start: 2021-07-07 — End: 2021-07-09

## 2021-07-07 MED ORDER — METOCLOPRAMIDE HCL 10 MG PO TABS
10.0000 mg | ORAL_TABLET | Freq: Three times a day (TID) | ORAL | Status: DC
Start: 2021-07-07 — End: 2021-07-09
  Administered 2021-07-07 – 2021-07-08 (×3): 10 mg via ORAL
  Filled 2021-07-07 (×5): qty 1

## 2021-07-07 MED ORDER — LACTATED RINGERS IV SOLN
INTRAVENOUS | Status: DC
Start: 2021-07-07 — End: 2021-07-07

## 2021-07-07 MED ORDER — CELECOXIB 200 MG PO CAPS: 400.0000 mg | ORAL_CAPSULE | Freq: Once | ORAL | Status: AC

## 2021-07-07 MED ORDER — ACETAMINOPHEN 10 MG/ML IV SOLN
INTRAVENOUS | Status: AC
  Filled 2021-07-07: qty 100

## 2021-07-07 MED ORDER — SODIUM CHLORIDE 0.9 % IV SOLN: INTRAVENOUS | Status: DC

## 2021-07-07 MED ORDER — TRANEXAMIC ACID-NACL 1000-0.7 MG/100ML-% IV SOLN: 1000.0000 mg | Freq: Once | INTRAVENOUS | Status: AC

## 2021-07-07 MED ORDER — CELECOXIB 200 MG PO CAPS
ORAL_CAPSULE | ORAL | Status: AC
  Administered 2021-07-07: 400 mg via ORAL
  Filled 2021-07-07: qty 2

## 2021-07-07 MED ORDER — GABAPENTIN 300 MG PO CAPS: 300.0000 mg | ORAL_CAPSULE | Freq: Once | ORAL | Status: AC

## 2021-07-07 MED ORDER — CHLORHEXIDINE GLUCONATE 0.12 % MT SOLN: 15.0000 mL | Freq: Once | OROMUCOSAL | Status: AC

## 2021-07-07 SURGICAL SUPPLY — 63 items
BLADE DRUM FLTD (BLADE) ×2 IMPLANT
BLADE SAW 90X25X1.19 OSCILLAT (BLADE) ×2 IMPLANT
CARTRIDGE OIL MAESTRO DRILL (MISCELLANEOUS) ×1 IMPLANT
CUP ACETBLR 54 OD 100 SERIES (Hips) ×2 IMPLANT
DIFFUSER DRILL AIR PNEUMATIC (MISCELLANEOUS) ×2 IMPLANT
DRAPE 3/4 80X56 (DRAPES) ×2 IMPLANT
DRAPE INCISE IOBAN 66X60 STRL (DRAPES) ×2 IMPLANT
DRSG DERMACEA 8X12 NADH (GAUZE/BANDAGES/DRESSINGS) ×2 IMPLANT
DRSG MEPILEX SACRM 8.7X9.8 (GAUZE/BANDAGES/DRESSINGS) ×2 IMPLANT
DRSG OPSITE POSTOP 4X12 (GAUZE/BANDAGES/DRESSINGS) IMPLANT
DRSG OPSITE POSTOP 4X14 (GAUZE/BANDAGES/DRESSINGS) ×2 IMPLANT
DRSG TEGADERM 4X4.75 (GAUZE/BANDAGES/DRESSINGS) ×2 IMPLANT
DURAPREP 26ML APPLICATOR (WOUND CARE) ×4 IMPLANT
ELECT CAUTERY BLADE 6.4 (BLADE) ×2 IMPLANT
ELECT REM PT RETURN 9FT ADLT (ELECTROSURGICAL) ×2
ELECTRODE REM PT RTRN 9FT ADLT (ELECTROSURGICAL) ×1 IMPLANT
GAUZE 4X4 16PLY ~~LOC~~+RFID DBL (SPONGE) ×2 IMPLANT
GLOVE SURG ENC MOIS LTX SZ7.5 (GLOVE) ×4 IMPLANT
GLOVE SURG ENC TEXT LTX SZ7.5 (GLOVE) ×4 IMPLANT
GLOVE SURG UNDER LTX SZ8 (GLOVE) ×2 IMPLANT
GLOVE SURG UNDER POLY LF SZ7.5 (GLOVE) ×2 IMPLANT
GOWN STRL REUS W/ TWL LRG LVL3 (GOWN DISPOSABLE) ×3 IMPLANT
GOWN STRL REUS W/ TWL XL LVL3 (GOWN DISPOSABLE) ×1 IMPLANT
GOWN STRL REUS W/TWL LRG LVL3 (GOWN DISPOSABLE) ×3
GOWN STRL REUS W/TWL XL LVL3 (GOWN DISPOSABLE) ×1
HANDLE YANKAUER SUCT BULB TIP (MISCELLANEOUS) ×2 IMPLANT
HEAD M SROM 36MM PLUS 1.5 (Hips) ×1 IMPLANT
HEMOVAC 400CC 10FR (MISCELLANEOUS) ×2 IMPLANT
HOLDER FOLEY CATH W/STRAP (MISCELLANEOUS) ×2 IMPLANT
HOOD PEEL AWAY FLYTE STAYCOOL (MISCELLANEOUS) ×4 IMPLANT
IRRIGATION SURGIPHOR STRL (IV SOLUTION) ×2 IMPLANT
IV NS IRRIG 3000ML ARTHROMATIC (IV SOLUTION) ×2 IMPLANT
KIT PEG BOARD PINK (KITS) ×2 IMPLANT
KIT TURNOVER KIT A (KITS) ×2 IMPLANT
LINER NEUTRAL 36ID 54OD (Liner) ×2 IMPLANT
MANIFOLD NEPTUNE II (INSTRUMENTS) ×4 IMPLANT
NDL SAFETY ECLIPSE 18X1.5 (NEEDLE) ×1 IMPLANT
NEEDLE HYPO 18GX1.5 SHARP (NEEDLE) ×1
NS IRRIG 500ML POUR BTL (IV SOLUTION) ×2 IMPLANT
OIL CARTRIDGE MAESTRO DRILL (MISCELLANEOUS) ×2
PACK HIP PROSTHESIS (MISCELLANEOUS) ×2 IMPLANT
PENCIL SMOKE EVACUATOR COATED (MISCELLANEOUS) ×2 IMPLANT
PIN STEIN THRED 5/32 (Pin) ×2 IMPLANT
PULSAVAC PLUS IRRIG FAN TIP (DISPOSABLE) ×2
SOL PREP PVP 2OZ (MISCELLANEOUS) ×2
SOLUTION PREP PVP 2OZ (MISCELLANEOUS) ×1 IMPLANT
SPONGE DRAIN TRACH 4X4 STRL 2S (GAUZE/BANDAGES/DRESSINGS) ×2 IMPLANT
SPONGE T-LAP 18X18 ~~LOC~~+RFID (SPONGE) ×6 IMPLANT
SROM M HEAD 36MM PLUS 1.5 (Hips) ×2 IMPLANT
STAPLER SKIN PROX 35W (STAPLE) ×2 IMPLANT
STEM FEM CMNTLSS LG AML 15.0 (Hips) ×2 IMPLANT
SUT ETHIBOND #5 BRAIDED 30INL (SUTURE) ×2 IMPLANT
SUT VIC AB 0 CT1 36 (SUTURE) ×2 IMPLANT
SUT VIC AB 1 CT1 36 (SUTURE) ×4 IMPLANT
SUT VIC AB 2-0 CT1 27 (SUTURE) ×1
SUT VIC AB 2-0 CT1 TAPERPNT 27 (SUTURE) ×1 IMPLANT
SYR 20ML LL LF (SYRINGE) ×2 IMPLANT
TAPE CLOTH 3X10 WHT NS LF (GAUZE/BANDAGES/DRESSINGS) ×2 IMPLANT
TAPE TRANSPORE STRL 2 31045 (GAUZE/BANDAGES/DRESSINGS) ×2 IMPLANT
TIP FAN IRRIG PULSAVAC PLUS (DISPOSABLE) ×1 IMPLANT
TOWEL OR 17X26 4PK STRL BLUE (TOWEL DISPOSABLE) ×2 IMPLANT
TRAY FOLEY MTR SLVR 16FR STAT (SET/KITS/TRAYS/PACK) ×2 IMPLANT
WATER STERILE IRR 500ML POUR (IV SOLUTION) ×2 IMPLANT

## 2021-07-07 NOTE — Progress Notes (Signed)
Patient awake/alert x4. No c/o's pain. Able to move bil ext side to side. States right "more awake then left" "Pins and needles on LLE" Dressing c/d/I  hemovac in place, patent. Bladder scan completed 33ml  afebrile

## 2021-07-07 NOTE — Evaluation (Signed)
Physical Therapy Evaluation Patient Details Name: Miranda Daniel MRN: [id number] DOB: 1946/04/30 Today's Date: 07/07/2021  History of Present Illness  75 y.o. female s/p L THA 07/07/21. PMH significant for R THA on 04/25/2021 and OA.  Clinical Impression  Pt received supine in bed, RN and family in room with patient reporting she needs to use the restroom. PT reviewed hip precautions prior to assisting pt to [facility] positioned over toilet in bathroom. Pt ambulated 67ft in each direction using RW and CGA. Urinary incontinence to the toilet - PT assisted with donning clean compression hose and socks. Pt performed all bed mobility with CGA for safety of LLE and increased time. MOD A for lifting provided to stand from both EOB and BSC. VC on sequencing, hand placement and maintaining precautions was provided throughout session with PT repeated cues on multiple occasions for pt safety. Daughter was able to recite precautions and instructed mother in maintaining as well. Pt became more and more forgetful as session continued reporting "the pain meds are kicking in." Due to forgetfulness, PT instructed pt in first 3 exercises in booklet and will review the remainder tomorrow. Pt also needs to practice 2 STE, no railing. Would benefit from skilled PT to address above deficits and promote optimal return to PLOF.      Recommendations for follow up therapy are one component of a multi-disciplinary discharge planning process, led by the attending physician.  Recommendations may be updated based on patient status, additional functional criteria and insurance authorization.  Follow Up Recommendations Home health PT;Supervision for mobility/OOB    Equipment Recommendations  None recommended by PT (pt owns all DME from previous THA)    Recommendations for Other Services       Precautions / Restrictions Precautions Precautions: Posterior Hip;Fall Precaution Booklet Issued: Yes (comment) Precaution Comments: Pt  able to recall 2 out of 3 precautions. Reviewed first 3 exercises in booklet - will need to review remainder. Restrictions Weight Bearing Restrictions: Yes LLE Weight Bearing: Weight bearing as tolerated      Mobility  Bed Mobility Overal bed mobility: Needs Assistance Bed Mobility: Supine to Sit;Sit to Supine     Supine to sit: Min guard Sit to supine: Min guard   General bed mobility comments: Pt able to maneuver LLE on her own, CGA for comfort in the case pt needed assistance    Transfers Overall transfer level: Needs assistance Equipment used: Rolling walker (2 wheeled) Transfers: Sit to/from Stand Sit to Stand: Mod assist         General transfer comment: MOD A for minimal lifting from EOB and BSC. VC on sequencing and hand placement to ensure hip precautions  Ambulation/Gait Ambulation/Gait assistance: Min guard Gait Distance (Feet): 10 Feet   Gait Pattern/deviations: Step-to pattern Gait velocity: decreased   General Gait Details: 44ft x2 reps to toilet and back to bed. CGA for safety, no steadying assist required. Step-to pattern leading with left; increased UE assist to decrease WB through LLE.  Stairs            Wheelchair Mobility    Modified Rankin (Stroke Patients Only)       Balance Overall balance assessment: Needs assistance Sitting-balance support: No upper extremity supported;Feet supported Sitting balance-Leahy Scale: Good Sitting balance - Comments: pt sat EOB during PLOF/home set-up conversation and while PT doffed and donned new compression stalkings and socks   Standing balance support: Bilateral upper extremity supported;During functional activity Standing balance-Leahy Scale: Poor Standing balance comment: Requires RW  for stability, majority of WB through RLE                             Pertinent Vitals/Pain Pain Assessment: 0-10 Pain Score: 7  Pain Location: L hip Pain Descriptors / Indicators:  Aching;Sharp Pain Intervention(s): Limited activity within patient's tolerance;Monitored during session;Premedicated before session;Repositioned;Ice applied    Home Living Family/patient expects to be discharged to:: Private residence Living Arrangements: Spouse/significant other Available Help at Discharge: Family;Available 24 hours/day Type of Home: House Home Access: Stairs to enter Entrance Stairs-Rails: None Entrance Stairs-Number of Steps: 2 Home Layout: Two level;Able to live on main level with bedroom/bathroom Home Equipment: Dan Humphreys - 2 wheels;Cane - single point;Bedside commode      Prior Function Level of Independence: Needs assistance   Gait / Transfers Assistance Needed: Recent recovery from R THA - was using RW, husband assisting as needed     Comments: No falls reported.     Hand Dominance   Dominant Hand: Right    Extremity/Trunk Assessment   Upper Extremity Assessment Upper Extremity Assessment: Overall WFL for tasks assessed    Lower Extremity Assessment Lower Extremity Assessment: LLE deficits/detail LLE Deficits / Details: s/p L THA. Continuing to report slightly diminished light touch in left lower leg       Communication   Communication: No difficulties  Cognition Arousal/Alertness: Awake/alert Behavior During Therapy: WFL for tasks assessed/performed Overall Cognitive Status: Within Functional Limits for tasks assessed                                 General Comments: A&Ox4      General Comments      Exercises Total Joint Exercises Ankle Circles/Pumps: AROM;Both;20 reps;Supine Quad Sets: AROM;Strengthening;Both;10 reps;Supine Gluteal Sets: AROM;Strengthening;Both;Supine;10 reps   Assessment/Plan    PT Assessment Patient needs continued PT services  PT Problem List Decreased strength;Decreased range of motion;Decreased activity tolerance;Decreased safety awareness;Decreased balance;Decreased mobility       PT  Treatment Interventions DME instruction;Balance training;Gait training;Neuromuscular re-education;Stair training;Functional mobility training;Therapeutic activities;Therapeutic exercise;Patient/family education    PT Goals (Current goals can be found in the Care Plan section)  Acute Rehab PT Goals Patient Stated Goal: to return home PT Goal Formulation: With patient/family Time For Goal Achievement: 07/21/21 Potential to Achieve Goals: Good    Frequency BID   Barriers to discharge        Co-evaluation               AM-PAC PT "6 Clicks" Mobility  Outcome Measure Help needed turning from your back to your side while in a flat bed without using bedrails?: A Little Help needed moving from lying on your back to sitting on the side of a flat bed without using bedrails?: A Little Help needed moving to and from a bed to a chair (including a wheelchair)?: A Little Help needed standing up from a chair using your arms (e.g., wheelchair or bedside chair)?: A Little Help needed to walk in hospital room?: A Little Help needed climbing 3-5 steps with a railing? : A Little 6 Click Score: 18    End of Session Equipment Utilized During Treatment: Gait belt Activity Tolerance: Patient tolerated treatment well Patient left: in bed;with call bell/phone within reach;with bed alarm set;with family/visitor present;with nursing/sitter in room Nurse Communication: Mobility status PT Visit Diagnosis: Unsteadiness on feet (R26.81);Other abnormalities of gait and mobility (R26.89);Difficulty  in walking, not elsewhere classified (R26.2);Muscle weakness (generalized) (M62.81)    Time: 9774-1423 PT Time Calculation (min) (ACUTE ONLY): 42 min   Charges:   PT Evaluation $PT Eval Low Complexity: 1 Low PT Treatments $Therapeutic Activity: 23-37 mins        Basilia Jumbo PT, DPT 07/07/21 5:36 PM [phone]   Lavenia Atlas 07/07/2021, 5:25 PM

## 2021-07-07 NOTE — H&P (Signed)
The patient has been re-examined, and the chart reviewed, and there have been no interval changes to the documented history and physical.    The risks, benefits, and alternatives have been discussed at length. The patient expressed understanding of the risks benefits and agreed with plans for surgical intervention.  Miranda Daniel P. Laurenashley Viar, [name] M.D.    

## 2021-07-07 NOTE — Plan of Care (Signed)

## 2021-07-07 NOTE — Anesthesia Preprocedure Evaluation (Signed)
Anesthesia Evaluation  Patient identified by MRN, date of birth, ID band Patient awake    Reviewed: Allergy & Precautions, NPO status , Patient's Chart, lab work & pertinent test results  History of Anesthesia Complications Negative for: history of anesthetic complications  Airway Mallampati: III  TM Distance: >3 FB Neck ROM: full    Dental  (+) Chipped   Pulmonary neg pulmonary ROS, neg shortness of breath,    Pulmonary exam normal        Cardiovascular Exercise Tolerance: Good (-) angina(-) Past MI and (-) DOE negative cardio ROS Normal cardiovascular exam     Neuro/Psych negative neurological ROS  negative psych ROS   GI/Hepatic Neg liver ROS, GERD  Controlled,  Endo/Other  negative endocrine ROS  Renal/GU      Musculoskeletal   Abdominal   Peds  Hematology negative hematology ROS (+)   Anesthesia Other Findings Past Medical History: No date: GERD (gastroesophageal reflux disease)  Past Surgical History: No date: ABDOMINAL HYSTERECTOMY 2012: colonsopcopy No date: TONSILLECTOMY 04/25/2021: TOTAL HIP ARTHROPLASTY; Right     Comment:  Procedure: RIGHT TOTAL HIP ARTHROPLASTY;  Surgeon:               Donato Heinz, MD;  Location: [facility] ORS;  Service:               Orthopedics;  Laterality: Right;  BMI    Body Mass Index: 29.94 kg/m      Reproductive/Obstetrics negative OB ROS                             Anesthesia Physical Anesthesia Plan  ASA: 3  Anesthesia Plan: Spinal   Post-op Pain Management:    Induction:   PONV Risk Score and Plan:   Airway Management Planned: Natural Airway and Nasal Cannula  Additional Equipment:   Intra-op Plan:   Post-operative Plan:   Informed Consent: I have reviewed the patients History and Physical, chart, labs and discussed the procedure including the risks, benefits and alternatives for the proposed anesthesia with the patient  or authorized representative who has indicated his/her understanding and acceptance.     Dental Advisory Given  Plan Discussed with: Anesthesiologist, CRNA and Surgeon  Anesthesia Plan Comments: (Patient reports no bleeding problems and no anticoagulant use.  Plan for spinal with backup GA  Patient consented for risks of anesthesia including but not limited to:  - adverse reactions to medications - damage to eyes, teeth, lips or other oral mucosa - nerve damage due to positioning  - risk of bleeding, infection and or nerve damage from spinal that could lead to paralysis - risk of headache or failed spinal - damage to teeth, lips or other oral mucosa - sore throat or hoarseness - damage to heart, brain, nerves, lungs, other parts of body or loss of life  Patient voiced understanding.)        Anesthesia Quick Evaluation

## 2021-07-07 NOTE — Transfer of Care (Signed)
Immediate Anesthesia Transfer of Care Note  Patient: Miranda Daniel  Procedure(s) Performed: TOTAL HIP ARTHROPLASTY (Left: Hip)  Patient Location: PACU  Anesthesia Type:Spinal  Level of Consciousness: drowsy  Airway & Oxygen Therapy: Patient Spontanous Breathing and Patient connected to face mask oxygen  Post-op Assessment: Report given to RN  Post vital signs: stable  Last Vitals:  Vitals Value Taken Time  BP    Temp    Pulse 102 07/07/21 1103  Resp 12 07/07/21 1103  SpO2 98 % 07/07/21 1103  Vitals shown include unvalidated device data.  Last Pain:  Vitals:   07/07/21 0620  TempSrc: Oral  PainSc: 0-No pain         Complications: No notable events documented.

## 2021-07-07 NOTE — Op Note (Signed)
OPERATIVE NOTE  DATE OF SURGERY:  07/07/2021  PATIENT NAME:  Miranda Daniel   DOB: 1946-03-07  MRN: [id number]  PRE-OPERATIVE DIAGNOSIS: Degenerative arthrosis of the left hip, primary  POST-OPERATIVE DIAGNOSIS:  Same  PROCEDURE:  Left total hip arthroplasty  SURGEON:  Jena Gauss. M.D.  ASSISTANT: Baldwin Jamaica, PA-C (present and scrubbed throughout the case, critical for assistance with exposure, retraction, instrumentation, and closure)  ANESTHESIA: spinal  ESTIMATED BLOOD LOSS: 75 mL  FLUIDS REPLACED: 1000 mL of crystalloid  DRAINS: 2 medium Hemovac drains  IMPLANTS UTILIZED: DePuy 15 mm large stature AML femoral stem, 54 mm OD Pinnacle 100 acetabular component, neutral Pinnacle Altrx polyethylene insert, and a 36 mm M-SPEC +1.5 mm hip ball  INDICATIONS FOR SURGERY: Miranda Daniel is a 75 y.o. year old female with a long history of progressive hip and groin  pain. X-rays demonstrated severe degenerative changes. The patient had not seen any significant improvement despite conservative nonsurgical intervention. After discussion of the risks and benefits of surgical intervention, the patient expressed understanding of the risks benefits and agree with plans for total hip arthroplasty.   The risks, benefits, and alternatives were discussed at length including but not limited to the risks of infection, bleeding, nerve injury, stiffness, blood clots, the need for revision surgery, limb length inequality, dislocation, cardiopulmonary complications, among others, and they were willing to proceed.  PROCEDURE IN DETAIL: The patient was brought into the operating room and, after adequate spinal anesthesia was achieved, the patient was placed in a right lateral decubitus position. Axillary roll was placed and all bony prominences were well-padded. The patient's left hip was cleaned and prepped with alcohol and DuraPrep and draped in the usual sterile fashion. A "timeout" was performed as  per usual protocol. A lateral curvilinear incision was made gently curving towards the posterior superior iliac spine. The IT band was incised in line with the skin incision and the fibers of the gluteus maximus were split in line. The piriformis tendon was identified, skeletonized, and incised at its insertion to the proximal femur and reflected posteriorly. A T type posterior capsulotomy was performed. Prior to dislocation of the femoral head, a threaded Steinmann pin was inserted through a separate stab incision into the pelvis superior to the acetabulum and bent in the form of a stylus so as to assess limb length and hip offset throughout the procedure. The femoral head was then dislocated posteriorly. Inspection of the femoral head demonstrated severe degenerative changes with full-thickness loss of articular cartilage. The femoral neck cut was performed using an oscillating saw. The anterior capsule was elevated off of the femoral neck using a periosteal elevator. Attention was then directed to the acetabulum. The remnant of the labrum was excised using electrocautery. Inspection of the acetabulum also demonstrated significant degenerative changes. The acetabulum was reamed in sequential fashion up to a 53 mm diameter. Good punctate bleeding bone was encountered. A 54 mm Pinnacle 100 acetabular component was positioned and impacted into place. Good scratch fit was appreciated. A neutral polyethylene trial was inserted.  Attention was then directed to the proximal femur. A hole for reaming of the proximal femoral canal was created using a high-speed burr. The femoral canal was reamed in sequential fashion up to a 14.5 mm diameter. This allowed for approximately 6 cm of scratch fit.  It was elected to ream up to a 15 mm diameter to allow for a line to line fit.  Serial broaches were inserted up to  a 15 mm large stature femoral broach. Calcar region was planed and a trial reduction was performed using a 36 mm  hip ball with a +1.5 mm neck length. Good equalization of limb lengths and hip offset was appreciated and excellent stability was noted both anteriorly and posteriorly. Trial components were removed. The acetabular shell was irrigated with copious amounts of normal saline with antibiotic solution and suctioned dry. A neutral Pinnacle Altrx polyethylene insert was positioned and impacted into place. Next, a 15 mm large stature AML femoral stem was positioned and impacted into place. Excellent scratch fit was appreciated. A trial reduction was again performed with a 36 mm hip ball with a +1.5 mm neck length. Again, good equalization of limb lengths was appreciated and excellent stability appreciated both anteriorly and posteriorly. The hip was then dislocated and the trial hip ball was removed. The Morse taper was cleaned and dried. A 36 mm M-SPEC hip ball with a +1.5 mm neck length was placed on the trunnion and impacted into place. The hip was then reduced and placed through range of motion. Excellent stability was appreciated both anteriorly and posteriorly.  The wound was irrigated with copious amounts of normal saline followed by 500 ml of Surgiphor and suctioned dry. Good hemostasis was appreciated. The posterior capsulotomy was repaired using #5 Ethibond. Piriformis tendon was reapproximated to the undersurface of the gluteus medius tendon using #5 Ethibond. The IT band was reapproximated using interrupted sutures of #1 Vicryl. Subcutaneous tissue was approximated using first #0 Vicryl followed by #2-0 Vicryl. The skin was closed with skin staples.  The patient tolerated the procedure well and was transported to the recovery room in stable condition.   Jena Gauss., M.D.

## 2021-07-08 DIAGNOSIS — M1612 Unilateral primary osteoarthritis, left hip: Secondary | ICD-10-CM | POA: Diagnosis not present

## 2021-07-08 NOTE — TOC Initial Note (Signed)
Transition of Care [facility]) - Initial/Assessment Note    Patient Details  Name: Miranda Daniel MRN: [id number] Date of Birth: 1946-03-15  [facility]) CM/SW Contact:    Caryn Section, RN Phone Number: 07/08/2021, 9:12 AM  Clinical Narrative:    Patient lives at home with spouse, has family support.  Patient has no concerns with transporting to appointments or to the pharmacy and reports that she is able to take medications as directed.  Patient has rolling walker at home, requests 3n1, ordered with [facility].  No further concerns about returning home, TOC contact information given.               Expected Discharge Plan: Home w Home Health Services Barriers to Discharge: Continued Medical Work up   Patient Goals and CMS Choice     Choice offered to / list presented to : NA  Expected Discharge Plan and Services Expected Discharge Plan: Home w Home Health Services   Discharge Planning Services: CM Consult Post Acute Care Choice: Durable Medical Equipment, Home Health Living arrangements for the past 2 months: Single Family Home                 DME Arranged: 3-N-1 DME Agency: [facility] Date DME Agency Contacted: 07/08/21 Time DME Agency Contacted: [phone] Representative spoke with at DME Agency: Bjorn Loser HH Arranged: PT, OT HH Agency: [facility] Date [facility] Agency Contacted: 07/08/21 Time HH Agency Contacted: 0911 Representative spoke with at [facility] Agency: Home health arranged by MD office prior to discharge, confirmed with [place] at [facility]  Prior Living Arrangements/Services Living arrangements for the past 2 months: Single Family Home Lives with:: Self, Spouse Patient language and need for interpreter reviewed:: Yes (Interpreter not required) Do you feel safe going back to the place where you live?: Yes      Need for Family Participation in Patient Care: Yes (Comment) Care giver support system in place?: Yes (comment)   Criminal  Activity/Legal Involvement Pertinent to Current Situation/Hospitalization: No - Comment as needed  Activities of Daily Living Home Assistive Devices/Equipment: Eyeglasses, Contact lenses ADL Screening (condition at time of admission) Patient's cognitive ability adequate to safely complete daily activities?: Yes Is the patient deaf or have difficulty hearing?: No Does the patient have difficulty seeing, even when wearing glasses/contacts?: No Does the patient have difficulty concentrating, remembering, or making decisions?: No Patient able to express need for assistance with ADLs?: Yes Does the patient have difficulty dressing or bathing?: No Independently performs ADLs?: Yes (appropriate for developmental age) Does the patient have difficulty walking or climbing stairs?: Yes Weakness of Legs: None Weakness of Arms/Hands: None  Permission Sought/Granted   Permission granted to share information with : Yes, Verbal Permission Granted     Permission granted to share info w AGENCY: Home health, DME        Emotional Assessment Appearance:: Appears stated age Attitude/Demeanor/Rapport: Engaged Affect (typically observed): Appropriate   Alcohol / Substance Use: Illicit Drugs Psych Involvement: No (comment)  Admission diagnosis:  Hx of total hip arthroplasty, left [Z00.923] Patient Active Problem List   Diagnosis Date Noted   Hx of total hip arthroplasty, left 07/07/2021   Hx of total hip arthroplasty, right 04/25/2021   Primary osteoarthritis of left hip 01/10/2021   PCP:  Marina Goodell, MD Pharmacy:   [facility] [address] [place] - [address] [place] [address] Phone: [phone] Fax: [phone]  Social Determinants of Health (SDOH) Interventions    Readmission Risk Interventions No flowsheet data found.   

## 2021-07-08 NOTE — Progress Notes (Signed)
Physical Therapy Treatment Patient Details Name: Miranda Daniel MRN: [id number] DOB: 1946/09/28 Today's Date: 07/08/2021   History of Present Illness 75 y.o. female s/p L THA 07/07/21. PMH significant for R THA on 04/25/2021 and OA.    PT Comments    Attempted PT several times this pm due to pt request with lunch, etc.  On final return, pt fatigued in bed after receiving Tramadol and requested stairs to be completed in am.  Therefor, pt completed B LE strengthening/circulation exercises in supine. Reviewed proper stair negotiation as completed during previous hip surgery. Will see pt first thing in am to complete stair training with family prior to d/c home.     Recommendations for follow up therapy are one component of a multi-disciplinary discharge planning process, led by the attending physician.  Recommendations may be updated based on patient status, additional functional criteria and insurance authorization.  Follow Up Recommendations  Home health PT;Supervision for mobility/OOB     Equipment Recommendations  None recommended by PT    Recommendations for Other Services       Precautions / Restrictions Precautions Precautions: Posterior Hip;Fall Precaution Booklet Issued: Yes (comment) Precaution Comments: confident with 2/3 precautions Restrictions Weight Bearing Restrictions: Yes LLE Weight Bearing: Weight bearing as tolerated     Mobility  Bed Mobility               General bed mobility comments: up to chair pre/post    Transfers Overall transfer level: Needs assistance Equipment used: Rolling walker (2 wheeled) Transfers: Sit to/from Stand Sit to Stand: Min guard;Supervision         General transfer comment: demos good control, one cue for foot placement to adhere to precautions with transfers.  Ambulation/Gait                 Stairs             Wheelchair Mobility    Modified Rankin (Stroke Patients Only)       Balance Overall  balance assessment: Needs assistance Sitting-balance support: No upper extremity supported;Feet supported Sitting balance-Leahy Scale: Good     Standing balance support: Bilateral upper extremity supported Standing balance-Leahy Scale: Fair                              Cognition Arousal/Alertness: Awake/alert Behavior During Therapy: WFL for tasks assessed/performed Overall Cognitive Status: Within Functional Limits for tasks assessed                                 General Comments: A&Ox4      Exercises General Exercises - Lower Extremity Ankle Circles/Pumps: AROM;Both;20 reps Quad Sets: AROM;15 reps;Both Gluteal Sets: AROM;Both;15 reps Heel Slides: AAROM;Right;AROM;Left;10 reps Hip ABduction/ADduction: AROM;Right;AAROM;Left;10 reps Other Exercises Other Exercises:  (Reviewed hip precutions with good recall. Discussed completing stairs in am since pt feeling "groggy" from tramadol)    General Comments        Pertinent Vitals/Pain Pain Assessment: No/denies pain Pain Score: 3  Pain Location: L hip Pain Descriptors / Indicators: Aching;Discomfort;Guarding Pain Intervention(s): Limited activity within patient's tolerance;Monitored during session;Premedicated before session    Home Living Family/patient expects to be discharged to:: Private residence Living Arrangements: Spouse/significant other Available Help at Discharge: Family;Available 24 hours/day Type of Home: House Home Access: Stairs to enter Entrance Stairs-Rails: None Home Layout: Two level;Able to live on main level with  bedroom/bathroom Home Equipment: Walker - 2 wheels;Cane - single point;Bedside commode;Toilet riser;Adaptive equipment      Prior Function Level of Independence: Needs assistance  Gait / Transfers Assistance Needed: Recent recovery from R THA - was using RW, husband assisting as needed   Comments: No falls reported.   PT Goals (current goals can now be found in  the care plan section) Acute Rehab PT Goals Patient Stated Goal: to return home Progress towards PT goals: Progressing toward goals    Frequency    BID      PT Plan Current plan remains appropriate    Co-evaluation              AM-PAC PT "6 Clicks" Mobility   Outcome Measure  Help needed turning from your back to your side while in a flat bed without using bedrails?: A Little Help needed moving from lying on your back to sitting on the side of a flat bed without using bedrails?: A Little Help needed moving to and from a bed to a chair (including a wheelchair)?: A Little Help needed standing up from a chair using your arms (e.g., wheelchair or bedside chair)?: A Little Help needed to walk in hospital room?: A Little Help needed climbing 3-5 steps with a railing? : A Little 6 Click Score: 18    End of Session         PT Visit Diagnosis: Unsteadiness on feet (R26.81);Other abnormalities of gait and mobility (R26.89);Difficulty in walking, not elsewhere classified (R26.2);Muscle weakness (generalized) (M62.81)     Time: 9741-6384 PT Time Calculation (min) (ACUTE ONLY): 13 min  Charges:  $Therapeutic Exercise: 8-22 mins                    Zadie Cleverly, PTA    Jannet Askew 07/08/2021, 4:25 PM

## 2021-07-08 NOTE — Discharge Summary (Addendum)
Physician Discharge Summary  Patient ID: Miranda Daniel MRN: [id number] DOB/AGE: October 22, 1945 75 y.o.  Admit date: 07/07/2021 Discharge date: 07/09/2021  Admission Diagnoses:  Hx of total hip arthroplasty, left [Z96.642]  Surgeries:Procedure(s):   Left total hip arthroplasty   SURGEON:  Jena Gauss. M.D.   ASSISTANT: Baldwin Jamaica, PA-C (present and scrubbed throughout the case, critical for assistance with exposure, retraction, instrumentation, and closure)   ANESTHESIA: spinal   ESTIMATED BLOOD LOSS: 75 mL   FLUIDS REPLACED: 1000 mL of crystalloid   DRAINS: 2 medium Hemovac drains   IMPLANTS UTILIZED: DePuy 15 mm large stature AML femoral stem, 54 mm OD Pinnacle 100 acetabular component, neutral Pinnacle Altrx polyethylene insert, and a 36 mm M-SPEC +1.5 mm hip ball  Discharge Diagnoses: Patient Active Problem List   Diagnosis Date Noted   Hx of total hip arthroplasty, left 07/07/2021   Hx of total hip arthroplasty, right 04/25/2021   Primary osteoarthritis of left hip 01/10/2021    Past Medical History:  Diagnosis Date   GERD (gastroesophageal reflux disease)      Transfusion:    Consultants (if any):   Discharged Condition: Improved  Hospital Course: Miranda Daniel is an 75 y.o. female who was admitted 07/07/2021 with a diagnosis of left hip osteoarthritis and went to the operating room on 07/07/2021 and underwent left total hip arthroplasty through posterior approach. The patient received perioperative antibiotics for prophylaxis (see below). The patient tolerated the procedure well and was transported to PACU in stable condition. After meeting PACU criteria, the patient was subsequently transferred to the Orthopaedics/Rehabilitation unit.   The patient received DVT prophylaxis in the form of early mobilization, Lovenox, Foot Pumps, and TED hose. A sacral pad had been placed and heels were elevated off of the bed with rolled towels in order to protect skin  integrity. Foley catheter was discontinued on postoperative day #0. Wound drains were discontinued on postoperative day #2. The surgical incision was healing well without signs of infection.  Physical therapy was initiated postoperatively for transfers, gait training, and strengthening. Occupational therapy was initiated for activities of daily living and evaluation for assisted devices. Rehabilitation goals were reviewed in detail with the patient. The patient made steady progress with physical therapy and physical therapy recommended discharge to Home.   The patient achieved the preliminary goals of this hospitalization and was felt to be medically and orthopaedically appropriate for discharge.  She was given perioperative antibiotics:  Anti-infectives (From admission, onward)    Start     Dose/Rate Route Frequency Ordered Stop   07/07/21 1345  ceFAZolin (ANCEF) IVPB 2g/100 mL premix        2 g 200 mL/hr over 30 Minutes Intravenous Every 6 hours 07/07/21 1053 07/07/21 2058   07/07/21 0625  ceFAZolin (ANCEF) 2-4 GM/100ML-% IVPB       Note to Pharmacy: Junius Creamer   : cabinet override      07/07/21 0625 07/07/21 0747   07/07/21 0600  ceFAZolin (ANCEF) IVPB 2g/100 mL premix        2 g 200 mL/hr over 30 Minutes Intravenous On call to O.R. 07/07/21 0128 07/07/21 0747     .  Recent vital signs:  Vitals:   07/08/21 2000 07/09/21 0510  BP: 120/74 116/64  Pulse: 83 88  Resp: 17 17  Temp: 98.1 F (36.7 C) 98.1 F (36.7 C)  SpO2: 100% 95%    Recent laboratory studies:  No results for input(s): WBC, HGB, HCT, PLT,  K, CL, CO2, BUN, CREATININE, GLUCOSE, CALCIUM, LABPT, INR in the last 72 hours.  Diagnostic Studies: DG Hip Port Unilat With Pelvis 1V Left  Result Date: 07/07/2021 CLINICAL DATA:  Postop left hip surgery. EXAM: DG HIP (WITH OR WITHOUT PELVIS) 1V PORT LEFT COMPARISON:  04/25/2021 FINDINGS: Postoperative changes from left total hip arthroplasty identified. The hardware  components are in anatomic alignment. There is no periprosthetic fracture or subluxation identified. Stable appearance of right total hip arthroplasty device. IMPRESSION: Status post left total hip arthroplasty. Electronically Signed   By: Signa Kell M.D.   On: 07/07/2021 11:56    Discharge Medications:   Allergies as of 07/09/2021       Reactions   Augmentin [amoxicillin-pot Clavulanate] Other (See Comments)   Upset stomach         Medication List     STOP taking these medications    naproxen sodium 220 MG tablet Commonly known as: ALEVE       TAKE these medications    acetaminophen 500 MG tablet Commonly known as: TYLENOL Take 500 mg by mouth every 8 (eight) hours as needed for moderate pain.   celecoxib 200 MG capsule Commonly known as: CELEBREX Take 1 capsule (200 mg total) by mouth 2 (two) times daily.   enoxaparin 40 MG/0.4ML injection Commonly known as: LOVENOX Inject 0.4 mLs (40 mg total) into the skin daily for 14 days.   esomeprazole 20 MG capsule Commonly known as: NEXIUM Take 20 mg by mouth daily as needed (acid reflux).               Durable Medical Equipment  (From admission, onward)           Start     Ordered   07/07/21 1229  DME Walker rolling  Once       Question:  Patient needs a walker to treat with the following condition  Answer:  S/P total hip arthroplasty   07/07/21 1228   07/07/21 1229  DME Bedside commode  Once       Question:  Patient needs a bedside commode to treat with the following condition  Answer:  S/P total hip arthroplasty   07/07/21 1228            Disposition: Home with home health PT     Follow-up Information     Hooten, Illene Labrador, MD Follow up on 08/21/2021.   Specialty: Orthopedic Surgery Why: at 10:45am Contact information: [address] [facility] [place] [address] [phone]                  Lasandra Beech, PA-C 07/09/2021, 8:25 AM

## 2021-07-08 NOTE — Evaluation (Signed)
Occupational Therapy Evaluation Patient Details Name: Miranda Daniel MRN: [id number] DOB: 02-Jan-1946 Today's Date: 07/08/2021   History of Present Illness 75 y.o. female s/p L THA 07/07/21. PMH significant for R THA on 04/25/2021 and OA.   Clinical Impression   Pt seen for OT evaluation this date in setting of acute hospitalization s/p elective L THA. Pt reports being INDEP at baseline, but endorses that she was becoming more and more limited d/t L hip pain prior to sx. Pt presents this date reporting 3/10 pain and significant improvement in tolerance for standing. Pt requires CGA/SUPV and RW for stability for STS transfer from chair. Pt requires MOD A for LB ADLs d/t post-op precautions, she has good understanding of AE education as she just underwent R hip in July. OT briefly covers adaptations for LB ADLs and considerations for THPs with pt and spouse. Both parties with good carryover. All OT needs met this session. No further f/u needs anticiapted.      Recommendations for follow up therapy are one component of a multi-disciplinary discharge planning process, led by the attending physician.  Recommendations may be updated based on patient status, additional functional criteria and insurance authorization.   Follow Up Recommendations  No OT follow up    Equipment Recommendations  None recommended by OT (has all necessary equipment per pt/spouse)    Recommendations for Other Services       Precautions / Restrictions Precautions Precautions: Posterior Hip;Fall Precaution Comments: confident with 2/3 precautions Restrictions Weight Bearing Restrictions: Yes LLE Weight Bearing: Weight bearing as tolerated      Mobility Bed Mobility               General bed mobility comments: up to chair pre/post    Transfers Overall transfer level: Needs assistance Equipment used: Rolling walker (2 wheeled) Transfers: Sit to/from Stand Sit to Stand: Min guard;Supervision          General transfer comment: demos good control, one cue for foot placement to adhere to precautions with transfers.    Balance Overall balance assessment: Needs assistance Sitting-balance support: No upper extremity supported;Feet supported Sitting balance-Leahy Scale: Good     Standing balance support: Bilateral upper extremity supported Standing balance-Leahy Scale: Fair                             ADL either performed or assessed with clinical judgement   ADL Overall ADL's : Needs assistance/impaired                                       General ADL Comments: requires assist for LB ADLs d/t post-op precautions, demos good understanding of modification with AE.     Vision   Additional Comments: able to track appropraitely.     Perception     Praxis      Pertinent Vitals/Pain Pain Assessment: 0-10 Pain Score: 3  Pain Location: L hip Pain Descriptors / Indicators: Aching;Discomfort;Guarding Pain Intervention(s): Limited activity within patient's tolerance;Monitored during session;Premedicated before session     Hand Dominance Right   Extremity/Trunk Assessment Upper Extremity Assessment Upper Extremity Assessment: Overall WFL for tasks assessed   Lower Extremity Assessment Lower Extremity Assessment: RLE deficits/detail;LLE deficits/detail RLE Deficits / Details: WFL RLE Sensation: WNL LLE Deficits / Details: s/p L THA, expected post-op ROM limitations d/t pain and precautions LLE Sensation: WNL  Communication Communication Communication: No difficulties   Cognition Arousal/Alertness: Awake/alert Behavior During Therapy: WFL for tasks assessed/performed Overall Cognitive Status: Within Functional Limits for tasks assessed                                 General Comments: A&Ox4   General Comments       Exercises Other Exercises Other Exercises: OT facilitates ed re: role of OT in acute setting, LB ADLs  with AE, safety considerations, THPs, compression stocking mgt. Pt and spouse with good understanding.   Shoulder Instructions      Home Living Family/patient expects to be discharged to:: Private residence Living Arrangements: Spouse/significant other Available Help at Discharge: Family;Available 24 hours/day Type of Home: House Home Access: Stairs to enter [employer] of Steps: 2 Entrance Stairs-Rails: None Home Layout: Two level;Able to live on main level with bedroom/bathroom Alternate Level Stairs-Number of Steps: Able to live on main floor   Bathroom Shower/Tub: Teacher, early years/pre: Standard     Home Equipment: Environmental consultant - 2 wheels;Cane - single point;Bedside commode;Toilet riser;Adaptive equipment Adaptive Equipment: Reacher        Prior Functioning/Environment Level of Independence: Needs assistance  Gait / Transfers Assistance Needed: Recent recovery from R THA - was using RW, husband assisting as needed     Comments: No falls reported.        OT Problem List: Decreased range of motion;Decreased activity tolerance      OT Treatment/Interventions: Self-care/ADL training;Therapeutic activities    OT Goals(Current goals can be found in the care plan section) Acute Rehab OT Goals Patient Stated Goal: to return home OT Goal Formulation: With patient/family Time For Goal Achievement: 07/22/21 Potential to Achieve Goals: Good  OT Frequency: Min 1X/week   Barriers to D/C:            Co-evaluation              AM-PAC OT "6 Clicks" Daily Activity     Outcome Measure Help from another person eating meals?: None Help from another person taking care of personal grooming?: None Help from another person toileting, which includes using toliet, bedpan, or urinal?: None Help from another person bathing (including washing, rinsing, drying)?: A Little Help from another person to put on and taking off regular upper body clothing?: None Help  from another person to put on and taking off regular lower body clothing?: A Little 6 Click Score: 22   End of Session Equipment Utilized During Treatment: Gait belt;Rolling walker Nurse Communication: Mobility status  Activity Tolerance: Patient tolerated treatment well Patient left: in chair;with call bell/phone within reach;with chair alarm set;with family/visitor present  OT Visit Diagnosis: Other abnormalities of gait and mobility (R26.89);Pain Pain - Right/Left: Left Pain - part of body: Hip                Time: 7681-1572 OT Time Calculation (min): 35 min Charges:  OT General Charges $OT Visit: 1 Visit OT Evaluation $OT Eval Moderate Complexity: 1 Mod OT Treatments $Self Care/Home Management : 8-22 mins  Gerrianne Scale, MS, OTR/L ascom [phone] 07/08/21, 2:47 PM

## 2021-07-08 NOTE — Progress Notes (Signed)
  Subjective: 1 Day Post-Op Procedure(s) (LRB): TOTAL HIP ARTHROPLASTY (Left) Patient reports pain as well-controlled.   Patient is well, and has had no acute complaints or problems Plan is to go Home after hospital stay. Negative for chest pain and shortness of breath Fever: no Gastrointestinal: negative for nausea and vomiting.  Patient has not had a bowel movement.  Objective: Vital signs in last 24 hours: Temp:  [97.5 F (36.4 C)-99.2 F (37.3 C)] 97.7 F (36.5 C) (10/04 0346) Pulse Rate:  [72-107] 72 (10/04 0346) Resp:  [12-18] 17 (10/04 0346) BP: (100-116)/(60-79) 109/60 (10/04 0346) SpO2:  [94 %-100 %] 100 % (10/04 0346)  Intake/Output from previous day:  Intake/Output Summary (Last 24 hours) at 07/08/2021 0739 Last data filed at 07/08/2021 0401 Gross per 24 hour  Intake 2844.64 ml  Output 795 ml  Net 2049.64 ml    Intake/Output this shift: No intake/output data recorded.  Labs: No results for input(s): HGB in the last 72 hours. No results for input(s): WBC, RBC, HCT, PLT in the last 72 hours. No results for input(s): NA, K, CL, CO2, BUN, CREATININE, GLUCOSE, CALCIUM in the last 72 hours. No results for input(s): LABPT, INR in the last 72 hours.   EXAM General - Patient is Alert, Appropriate, and Oriented Extremity - Neurovascular intact Dorsiflexion/Plantar flexion intact Compartment soft Dressing/Incision -clean, dry, no drainage, Postoperative dressing remains in place., Hemovac in place.  Motor Function - intact, moving foot and toes well on exam.  Cardiovascular- Regular rate and rhythm, no murmurs/rubs/gallops Respiratory- Lungs clear to auscultation bilaterally Gastrointestinal- soft, nontender, and active bowel sounds   Assessment/Plan: 1 Day Post-Op Procedure(s) (LRB): TOTAL HIP ARTHROPLASTY (Left) Active Problems:   Hx of total hip arthroplasty, left  Estimated body mass index is 29.94 kg/m as calculated from the following:   Height as of  this encounter: 5\' 7"  (1.702 m).   Weight as of this encounter: 86.7 kg. Advance diet Up with therapy  Maintain posterior hip precautions.   DVT Prophylaxis - Lovenox, Ted hose, and foot pumps Weight-Bearing as tolerated to left leg  , PA-C [facility] 07/08/2021, 7:39 AM

## 2021-07-08 NOTE — Progress Notes (Signed)
Physical Therapy Treatment Patient Details Name: Miranda Daniel MRN: [id number] DOB: 1946/01/16 Today's Date: 07/08/2021   History of Present Illness 75 y.o. female s/p L THA 07/07/21. PMH significant for R THA on 04/25/2021 and OA.    PT Comments    Good tolerance for am session. Pt able to raise from recliner with supervision to RW. Gait training with RW 150+ feet with CG/Supervision. Good demonstration of upright posture despite discomfort during weight bearing L LE. Pt returned to room/recliner, c/o slight dizziness/fatigue upon exertion. Pt placed in reclined position with good relief. Will attempt stairs this pm.    Recommendations for follow up therapy are one component of a multi-disciplinary discharge planning process, led by the attending physician.  Recommendations may be updated based on patient status, additional functional criteria and insurance authorization.  Follow Up Recommendations  Home health PT;Supervision for mobility/OOB     Equipment Recommendations  None recommended by PT    Recommendations for Other Services       Precautions / Restrictions Precautions Precautions: Posterior Hip;Fall Precaution Booklet Issued: Yes (comment) Precaution Comments: Pt able to recall 2 out of 3 precautions. Reviewed first 3 exercises in booklet - will need to review remainder. Restrictions Weight Bearing Restrictions: Yes LLE Weight Bearing: Weight bearing as tolerated     Mobility  Bed Mobility Overal bed mobility: Needs Assistance Bed Mobility: Supine to Sit;Sit to Supine     Supine to sit: Min guard Sit to supine: Min guard   General bed mobility comments: Pt able to maneuver LLE on her own, CGA for comfort in the case pt needed assistance    Transfers Overall transfer level: Needs assistance Equipment used: Rolling walker (2 wheeled) Transfers: Sit to/from Stand Sit to Stand: Min guard         General transfer comment:  (Good demonstration of  technique)  Ambulation/Gait Ambulation/Gait assistance: Min guard;Supervision Gait Distance (Feet): 150 Feet Assistive device: Rolling walker (2 wheeled) Gait Pattern/deviations: Step-through pattern;Decreased weight shift to left Gait velocity: decreased       Stairs             Wheelchair Mobility    Modified Rankin (Stroke Patients Only)       Balance                                            Cognition Arousal/Alertness: Awake/alert Behavior During Therapy: WFL for tasks assessed/performed Overall Cognitive Status: Within Functional Limits for tasks assessed                                 General Comments: A&Ox4      Exercises Total Joint Exercises Ankle Circles/Pumps: AROM;Both;20 reps;Supine Quad Sets: AROM;Strengthening;Both;10 reps;Supine Gluteal Sets: AROM;Strengthening;Both;Supine;10 reps    General Comments        Pertinent Vitals/Pain Pain Assessment: 0-10 Pain Score: 3  Pain Location: L hip Pain Descriptors / Indicators: Aching;Discomfort;Guarding Pain Intervention(s): Monitored during session;Premedicated before session;Ice applied    Home Living                      Prior Function            PT Goals (current goals can now be found in the care plan section) Acute Rehab PT Goals Patient Stated Goal: to return  home    Frequency    BID      PT Plan Current plan remains appropriate    Co-evaluation              AM-PAC PT "6 Clicks" Mobility   Outcome Measure  Help needed turning from your back to your side while in a flat bed without using bedrails?: A Little Help needed moving from lying on your back to sitting on the side of a flat bed without using bedrails?: A Little Help needed moving to and from a bed to a chair (including a wheelchair)?: A Little Help needed standing up from a chair using your arms (e.g., wheelchair or bedside chair)?: A Little Help needed to walk in  hospital room?: A Little Help needed climbing 3-5 steps with a railing? : A Little 6 Click Score: 18    End of Session Equipment Utilized During Treatment: Gait belt Activity Tolerance: Patient tolerated treatment well Patient left: in chair;with call bell/phone within reach;with chair alarm set;with SCD's reapplied Nurse Communication: Mobility status PT Visit Diagnosis: Unsteadiness on feet (R26.81);Other abnormalities of gait and mobility (R26.89);Difficulty in walking, not elsewhere classified (R26.2);Muscle weakness (generalized) (M62.81)     Time: 2060-1561 PT Time Calculation (min) (ACUTE ONLY): 30 min  Charges:  $Gait Training: 8-22 mins $Therapeutic Activity: 8-22 mins                    Zadie Cleverly, PTA    Jannet Askew 07/08/2021, 11:01 AM

## 2021-07-08 NOTE — Anesthesia Postprocedure Evaluation (Signed)
Anesthesia Post Note  Patient: Miranda Daniel  Procedure(s) Performed: TOTAL HIP ARTHROPLASTY (Left: Hip)  Patient location during evaluation: Nursing Unit Anesthesia Type: Spinal Level of consciousness: oriented and awake and alert Pain management: pain level controlled Vital Signs Assessment: post-procedure vital signs reviewed and stable Respiratory status: spontaneous breathing and respiratory function stable Cardiovascular status: blood pressure returned to baseline and stable Postop Assessment: no headache, no backache, no apparent nausea or vomiting and patient able to bend at knees Anesthetic complications: no   No notable events documented.   Last Vitals:  Vitals:   07/08/21 0346 07/08/21 0802  BP: 109/60 (!) 117/54  Pulse: 72 84  Resp: 17 18  Temp: 36.5 C 36.7 C  SpO2: 100% 96%    Last Pain:  Vitals:   07/08/21 0802  TempSrc: Oral  PainSc:                  [name]

## 2021-07-09 ENCOUNTER — Ambulatory Visit

## 2021-07-09 DIAGNOSIS — M1612 Unilateral primary osteoarthritis, left hip: Secondary | ICD-10-CM | POA: Diagnosis not present

## 2021-07-09 LAB — SURGICAL PATHOLOGY

## 2021-07-09 MED ORDER — ENOXAPARIN SODIUM 40 MG/0.4ML IJ SOSY: 40.0000 mg | PREFILLED_SYRINGE | INTRAMUSCULAR | 0 refills | Status: AC

## 2021-07-09 MED ORDER — CELECOXIB 200 MG PO CAPS: 200.0000 mg | ORAL_CAPSULE | Freq: Two times a day (BID) | ORAL | 0 refills | Status: AC

## 2021-07-09 NOTE — TOC Progression Note (Signed)
Transition of Care [facility]) - Progression Note    Patient Details  Name: Miranda Daniel MRN: [id number] Date of Birth: Jan 29, 1946  Transition of [facility]) CM/SW Contact  Caryn Section, RN Phone Number: 07/09/2021, 9:32 AM  Clinical Narrative:   Patient is expected to be discharged today.  Patient and family state they have DME at home and [facility] has arranged to see patient tomorrow morning.   They state they do not have any further needs from [facility] at this time.    Expected Discharge Plan: Home w Home Health Services Barriers to Discharge: Continued Medical Work up  Expected Discharge Plan and Services Expected Discharge Plan: Home w Home Health Services   Discharge Planning Services: CM Consult Post Acute Care Choice: Durable Medical Equipment, Home Health Living arrangements for the past 2 months: Single Family Home Expected Discharge Date: 07/09/21               DME Arranged: 3-N-1 DME Agency: [employer] Date DME Agency Contacted: 07/08/21 Time DME Agency Contacted: [phone] Representative spoke with at DME Agency: Bjorn Loser HH Arranged: PT, OT HH Agency: [facility] Date [facility] Agency Contacted: 07/08/21 Time HH Agency Contacted: 0911 Representative spoke with at [facility] Agency: Home health arranged by MD office prior to discharge, confirmed with [place] at [employer]   Social Determinants of Health (SDOH) Interventions    Readmission Risk Interventions No flowsheet data found.

## 2021-07-09 NOTE — Progress Notes (Signed)
Discharge Note: Reviewed discharge instructions with pt. PT verbalized understanding. PT d/ced with extra honeycomb dressing, all personal belongings, and incentive spirometer. IV catheter intact upon removal. PT with compression socks on. Extra compression sent home upon pt's request. Staff wheeled pt out. Pt transported to home via family private vehicle.

## 2021-07-09 NOTE — Progress Notes (Signed)
  Subjective: 2 Days Post-Op Procedure(s) (LRB): TOTAL HIP ARTHROPLASTY (Left) Patient reports pain as well-controlled.   Patient is well, and has had no acute complaints or problems Plan is to go Home after hospital stay. Negative for chest pain and shortness of breath Fever: no Gastrointestinal: negative for nausea and vomiting.  Patient has not had a bowel movement.  Objective: Vital signs in last 24 hours: Temp:  [97.7 F (36.5 C)-98.1 F (36.7 C)] 98.1 F (36.7 C) (10/05 0510) Pulse Rate:  [83-92] 88 (10/05 0510) Resp:  [17] 17 (10/05 0510) BP: (111-120)/(54-74) 116/64 (10/05 0510) SpO2:  [95 %-100 %] 95 % (10/05 0510)  Intake/Output from previous day:  Intake/Output Summary (Last 24 hours) at 07/09/2021 0803 Last data filed at 07/09/2021 0600 Gross per 24 hour  Intake 240 ml  Output 240 ml  Net 0 ml    Intake/Output this shift: No intake/output data recorded.  Labs: No results for input(s): HGB in the last 72 hours. No results for input(s): WBC, RBC, HCT, PLT in the last 72 hours. No results for input(s): NA, K, CL, CO2, BUN, CREATININE, GLUCOSE, CALCIUM in the last 72 hours. No results for input(s): LABPT, INR in the last 72 hours.   EXAM General - Patient is Alert, Appropriate, and Oriented Extremity - Neurovascular intact Dorsiflexion/Plantar flexion intact Compartment soft Dressing/Incision -clean, dry, no drainage, Hemovac in place.  Motor Function - intact, moving foot and toes well on exam.    Cardiovascular- Regular rate and rhythm, no murmurs/rubs/gallops Respiratory- Lungs clear to auscultation bilaterally Gastrointestinal- soft, nontender, and active bowel sounds   Assessment/Plan: 2 Days Post-Op Procedure(s) (LRB): TOTAL HIP ARTHROPLASTY (Left) Active Problems:   Hx of total hip arthroplasty, left  Estimated body mass index is 29.94 kg/m as calculated from the following:   Height as of this encounter: 5\' 7"  (1.702 m).   Weight as of this  encounter: 86.7 kg. Advance diet Up with therapy Discharge home with home health  Maintain posterior hip precautions   Hemovac removed. and Mini compression dressing applied.   DVT Prophylaxis - Lovenox, Ted hose, and foot pumps Weight-Bearing as tolerated to left leg  , PA-C [facility] Orthopaedic Surgery 07/09/2021, 8:03 AM

## 2021-07-09 NOTE — Progress Notes (Signed)
Physical Therapy Treatment Patient Details Name: Miranda Daniel MRN: [id number] DOB: 09-01-1946 Today's Date: 07/09/2021   History of Present Illness 75 y.o. female s/p L THA 07/07/21. PMH significant for R THA on 04/25/2021 and OA.    PT Comments    Pt completed 2 steps without rail, use of RW, and supervision from husband.  Pt with good demonstration of safe negotiation and appropriate technique. Pt completed 119ft of ambulation with RW and supervision. All questions answered regarding safe transition home today. Pt appears ready for d/c.  HHPT to begin tomorrow am.   Recommendations for follow up therapy are one component of a multi-disciplinary discharge planning process, led by the attending physician.  Recommendations may be updated based on patient status, additional functional criteria and insurance authorization.  Follow Up Recommendations  Home health PT;Supervision for mobility/OOB     Equipment Recommendations  None recommended by PT    Recommendations for Other Services       Precautions / Restrictions Precautions Precautions: Posterior Hip;Fall Precaution Booklet Issued: Yes (comment) Precaution Comments:  (Able to recall all precautions) Restrictions Weight Bearing Restrictions: Yes LLE Weight Bearing: Weight bearing as tolerated     Mobility  Bed Mobility                    Transfers                    Ambulation/Gait Ambulation/Gait assistance: Supervision Gait Distance (Feet):  (150) Assistive device: Rolling walker (2 wheeled) Gait Pattern/deviations: Step-through pattern;Decreased weight shift to left Gait velocity: decreased   General Gait Details:  (cues for Left toe out during gait, and be mindfull of piviting on L LE)   Stairs Stairs: Yes Stairs assistance: Supervision Stair Management: No rails;Backwards Number of Stairs: 2 General stair comments: Good demonstration and recall of proper stair negotiation, husband present  to assist   Wheelchair Mobility    Modified Rankin (Stroke Patients Only)       Balance                                            Cognition Arousal/Alertness: Awake/alert Behavior During Therapy: WFL for tasks assessed/performed Overall Cognitive Status: Within Functional Limits for tasks assessed                                 General Comments: A&Ox4      Exercises      General Comments General comments (skin integrity, edema, etc.):  (Reviewed HEP, dicsussed activity at home and proper sleeping positions)      Pertinent Vitals/Pain Pain Assessment: No/denies pain    Home Living                      Prior Function            PT Goals (current goals can now be found in the care plan section) Acute Rehab PT Goals Patient Stated Goal: to return home Progress towards PT goals: Progressing toward goals    Frequency    BID      PT Plan Current plan remains appropriate    Co-evaluation              AM-PAC PT "6 Clicks" Mobility   Outcome Measure  Help needed  turning from your back to your side while in a flat bed without using bedrails?: A Little Help needed moving from lying on your back to sitting on the side of a flat bed without using bedrails?: A Little Help needed moving to and from a bed to a chair (including a wheelchair)?: A Little Help needed standing up from a chair using your arms (e.g., wheelchair or bedside chair)?: A Little Help needed to walk in hospital room?: A Little Help needed climbing 3-5 steps with a railing? : A Little 6 Click Score: 18    End of Session Equipment Utilized During Treatment: Gait belt Activity Tolerance: Patient tolerated treatment well Patient left: in chair;with call bell/phone within reach;with chair alarm set;with SCD's reapplied Nurse Communication: Mobility status PT Visit Diagnosis: Unsteadiness on feet (R26.81);Other abnormalities of gait and mobility  (R26.89);Difficulty in walking, not elsewhere classified (R26.2);Muscle weakness (generalized) (M62.81)     Time: 4680-3212 PT Time Calculation (min) (ACUTE ONLY): 25 min  Charges:  $Gait Training: 8-22 mins $Therapeutic Activity: 8-22 mins                    Zadie Cleverly, PTA    Jannet Askew 07/09/2021, 9:13 AM

## 2021-07-10 LAB — IGE: IgE (Immunoglobulin E), Serum: 62 IU/mL (ref 6–495)

## 2021-10-22 ENCOUNTER — Ambulatory Visit
Admission: RE | Admit: 2021-10-22 | Discharge: 2021-10-22 | Disposition: A | Discharge status: Home / Self Care | Payer: Medicare Other | Source: Ambulatory Visit | Attending: Family Medicine | Admitting: Family Medicine

## 2021-10-22 ENCOUNTER — Other Ambulatory Visit: Payer: Self-pay

## 2021-10-22 DIAGNOSIS — Z1231 Encounter for screening mammogram for malignant neoplasm of breast: Secondary | ICD-10-CM | POA: Insufficient documentation

## 2021-11-03 ENCOUNTER — Encounter: Payer: Self-pay | Admitting: *Deleted

## 2021-11-04 ENCOUNTER — Encounter
Admission: RE | Disposition: A | Discharge status: Home / Self Care | Payer: Self-pay | Source: Ambulatory Visit | Attending: Gastroenterology

## 2021-11-04 ENCOUNTER — Ambulatory Visit: Payer: Medicare Other | Admitting: Urgent Care

## 2021-11-04 ENCOUNTER — Encounter: Payer: Self-pay | Admitting: *Deleted

## 2021-11-04 ENCOUNTER — Ambulatory Visit
Admission: RE | Admit: 2021-11-04 | Discharge: 2021-11-04 | Disposition: A | Discharge status: Home / Self Care | Payer: Medicare Other | Source: Ambulatory Visit | Attending: Gastroenterology | Admitting: Gastroenterology

## 2021-11-04 DIAGNOSIS — K219 Gastro-esophageal reflux disease without esophagitis: Secondary | ICD-10-CM | POA: Insufficient documentation

## 2021-11-04 DIAGNOSIS — Z800 Family history of malignant neoplasm of digestive organs: Secondary | ICD-10-CM | POA: Insufficient documentation

## 2021-11-04 DIAGNOSIS — Z791 Long term (current) use of non-steroidal anti-inflammatories (NSAID): Secondary | ICD-10-CM | POA: Insufficient documentation

## 2021-11-04 DIAGNOSIS — K5730 Diverticulosis of large intestine without perforation or abscess without bleeding: Secondary | ICD-10-CM | POA: Diagnosis not present

## 2021-11-04 DIAGNOSIS — M1990 Unspecified osteoarthritis, unspecified site: Secondary | ICD-10-CM | POA: Diagnosis not present

## 2021-11-04 DIAGNOSIS — K640 First degree hemorrhoids: Secondary | ICD-10-CM | POA: Diagnosis not present

## 2021-11-04 DIAGNOSIS — Z1211 Encounter for screening for malignant neoplasm of colon: Secondary | ICD-10-CM | POA: Insufficient documentation

## 2021-11-04 HISTORY — PX: COLONOSCOPY WITH PROPOFOL: SHX5780

## 2021-11-04 HISTORY — DX: Unspecified osteoarthritis, unspecified site: M19.90

## 2021-11-04 SURGERY — COLONOSCOPY WITH PROPOFOL
Anesthesia: General

## 2021-11-04 MED ORDER — PHENYLEPHRINE HCL (PRESSORS) 10 MG/ML IV SOLN
INTRAVENOUS | Status: AC
  Filled 2021-11-04: qty 1

## 2021-11-04 MED ORDER — CLINDAMYCIN PHOSPHATE 600 MG/50ML IV SOLN
INTRAVENOUS | Status: AC
  Filled 2021-11-04: qty 50

## 2021-11-04 MED ORDER — SODIUM CHLORIDE 0.9 % IV SOLN: INTRAVENOUS | Status: DC

## 2021-11-04 MED ORDER — PROPOFOL 500 MG/50ML IV EMUL
INTRAVENOUS | Status: AC
  Filled 2021-11-04: qty 50

## 2021-11-04 MED ORDER — PROPOFOL 10 MG/ML IV BOLUS
INTRAVENOUS | Status: DC | PRN
Start: 2021-11-04 — End: 2021-11-04
  Administered 2021-11-04: 50 mg via INTRAVENOUS
  Administered 2021-11-04: 10 mg via INTRAVENOUS

## 2021-11-04 MED ORDER — CLINDAMYCIN PHOSPHATE 300 MG/2ML IJ SOLN
900.0000 mg | Freq: Once | INTRAMUSCULAR | Status: DC
Start: 2021-11-04 — End: 2021-11-04

## 2021-11-04 MED ORDER — CLINDAMYCIN PHOSPHATE 600 MG/50ML IV SOLN
600.0000 mg | Freq: Once | INTRAVENOUS | Status: AC
  Administered 2021-11-04: 600 mg via INTRAVENOUS

## 2021-11-04 MED ORDER — PROPOFOL 500 MG/50ML IV EMUL
INTRAVENOUS | Status: DC | PRN
  Administered 2021-11-04: 200 ug/kg/min via INTRAVENOUS

## 2021-11-04 NOTE — Interval H&P Note (Signed)
History and Physical Interval Note:  11/04/2021 7:44 AM  Miranda Daniel  has presented today for surgery, with the diagnosis of SCREENING.  The various methods of treatment have been discussed with the patient and family. After consideration of risks, benefits and other options for treatment, the patient has consented to  Procedure(s) with comments: COLONOSCOPY WITH PROPOFOL (N/A) - OFFICE STATES PATIENT NEEDS CLINDAMYCIN 600 MG as a surgical intervention.  The patient's history has been reviewed, patient examined, no change in status, stable for surgery.  I have reviewed the patient's chart and labs.  Questions were answered to the patient's satisfaction.     Lesly Rubenstein  Ok to proceed with colonoscopy

## 2021-11-04 NOTE — Op Note (Signed)
[facility]astroenter[facility] Patient Name: Miranda Daniel Procedure Date: 11/04/2021 7:18 AM MRN: [id number] Account #: [id number] Date of Birth: 1945/10/13 Admit Type: Outpatient Age: 76 Room: [facility] ENDO ROOM 1 Gender: Female Note Status: Finalized Instrument Name: Park Meo [id number] Procedure:             Colonoscopy Indications:           Screening for colorectal malignant neoplasm Providers:             Andrey Farmer MD, MD Medicines:             Monitored Anesthesia Care Complications:         No immediate complications. Procedure:             Pre-Anesthesia Assessment:                        - Prior to the procedure, a History and Physical was                         performed, and patient medications and allergies were                         reviewed. The patient is competent. The risks and                         benefits of the procedure and the sedation options and                         risks were discussed with the patient. All questions                         were answered and informed consent was obtained.                         Patient identification and proposed procedure were                         verified by the physician, the nurse, the                         anesthesiologist, the anesthetist and the technician                         in the endoscopy suite. Mental Status Examination:                         alert and oriented. Airway Examination: normal                         oropharyngeal airway and neck mobility. Respiratory                         Examination: clear to auscultation. CV Examination:                         normal. Prophylactic Antibiotics: The patient requires                         prophylactic antibiotics due to a prior history of  prosthetic joint replacement. Prior Anticoagulants:                         The patient has taken no previous anticoagulant or                         antiplatelet  agents. ASA Grade Assessment: II - A                         patient with mild systemic disease. After reviewing                         the risks and benefits, the patient was deemed in                         satisfactory condition to undergo the procedure. The                         anesthesia plan was to use monitored anesthesia care                         (MAC). Immediately prior to administration of                         medications, the patient was re-assessed for adequacy                         to receive sedatives. The heart rate, respiratory                         rate, oxygen saturations, blood pressure, adequacy of                         pulmonary ventilation, and response to care were                         monitored throughout the procedure. The physical                         status of the patient was re-assessed after the                         procedure.                        After obtaining informed consent, the colonoscope was                         passed under direct vision. Throughout the procedure,                         the patient's blood pressure, pulse, and oxygen                         saturations were monitored continuously. The                         Colonoscope was introduced through the anus and  advanced to the the cecum, identified by appendiceal                         orifice and ileocecal valve. The colonoscopy was                         performed without difficulty. The patient tolerated                         the procedure well. The quality of the bowel                         preparation was good. Findings:      The perianal and digital rectal examinations were normal.      Scattered small-mouthed diverticula were found in the sigmoid colon,       descending colon, transverse colon and ascending colon.      Internal hemorrhoids were found during retroflexion. The hemorrhoids       were Grade I (internal  hemorrhoids that do not prolapse).      The exam was otherwise without abnormality on direct and retroflexion       views. Impression:            - Diverticulosis in the sigmoid colon, in the                         descending colon, in the transverse colon and in the                         ascending colon.                        - Internal hemorrhoids.                        - The examination was otherwise normal on direct and                         retroflexion views.                        - No specimens collected. Recommendation:        - Discharge patient to home.                        - Resume previous diet.                        - Continue present medications.                        - Repeat colonoscopy is not recommended due to current                         age (28 years or older) for screening purposes.                        - Return to referring physician as previously                         scheduled. Procedure Code(s):     ---  Professional ---                        C3403, Colorectal cancer screening; colonoscopy on                         individual not meeting criteria for high risk Diagnosis Code(s):     --- Professional ---                        Z12.11, Encounter for screening for malignant neoplasm                         of colon                        K64.0, First degree hemorrhoids                        K57.30, Diverticulosis of large intestine without                         perforation or abscess without bleeding CPT copyright 2019 [employer]. All rights reserved. The codes documented in this report are preliminary and upon coder review may  be revised to meet current compliance requirements. Andrey Farmer MD, MD 11/04/2021 8:17:55 AM Number of Addenda: 0 Note Initiated On: 11/04/2021 7:18 AM Scope Withdrawal Time: 0 hours 6 minutes 7 seconds  Total Procedure Duration: 0 hours 17 minutes 12 seconds  Estimated Blood Loss:  Estimated  blood loss: none.      [facility]

## 2021-11-04 NOTE — Transfer of Care (Signed)
Immediate Anesthesia Transfer of Care Note  Patient: Miranda Daniel  Procedure(s) Performed: COLONOSCOPY WITH PROPOFOL  Patient Location: PACU  Anesthesia Type:General  Level of Consciousness: sedated  Airway & Oxygen Therapy: Patient Spontanous Breathing and Patient connected to nasal cannula oxygen  Post-op Assessment: Report given to RN and Post -op Vital signs reviewed and stable  Post vital signs: Reviewed and stable  Last Vitals:  Vitals Value Taken Time  BP 97/51 11/04/21 0818  Temp    Pulse 82 11/04/21 0819  Resp 15 11/04/21 0819  SpO2 97 % 11/04/21 0819  Vitals shown include unvalidated device data.  Last Pain:  Vitals:   11/04/21 0705  TempSrc: Temporal  PainSc: 0-No pain         Complications: No notable events documented.

## 2021-11-04 NOTE — Anesthesia Postprocedure Evaluation (Signed)
Anesthesia Post Note  Patient: [name]  Procedure(s) Performed: COLONOSCOPY WITH PROPOFOL  Patient location during evaluation: Endoscopy Anesthesia Type: General Level of consciousness: awake and alert Pain management: pain level controlled Vital Signs Assessment: post-procedure vital signs reviewed and stable Respiratory status: spontaneous breathing, nonlabored ventilation, respiratory function stable and patient connected to nasal cannula oxygen Cardiovascular status: blood pressure returned to baseline and stable Postop Assessment: no apparent nausea or vomiting Anesthetic complications: no   No notable events documented.   Last Vitals:  Vitals:   11/04/21 0820 11/04/21 0830  BP: (!) 97/51 100/83  Pulse: 82 86  Resp: 15 19  Temp:    SpO2: 97% 98%    Last Pain:  Vitals:   11/04/21 0705  TempSrc: Temporal  PainSc: 0-No pain                 Miranda Daniel

## 2021-11-04 NOTE — H&P (Signed)
Outpatient short stay form Pre-procedure 11/04/2021  Regis Bill, MD  Primary Physician: Marina Goodell, MD  Reason for visit:  Screening colonoscopy  History of present illness:    76 y/o lady with history of hip replacements here for screening colonoscopy. Had colonoscopy over 10 years ago that was normal. No blood thinners. An aunt had colon cancer but no first degree relatives. History of hysterectomy and appendectomy. No new symptoms at this time.    Current Facility-Administered Medications:    0.9 %  sodium chloride infusion, , Intravenous, Continuous, Shantrice Rodenberg, Rossie Muskrat, MD, Last Rate: 20 mL/hr at 11/04/21 0719, New Bag at 11/04/21 0719   clindamycin (CLEOCIN) IVPB 600 mg, 600 mg, Intravenous, Once, Lathan Gieselman, Rossie Muskrat, MD  Medications Prior to Admission  Medication Sig Dispense Refill Last Dose   esomeprazole (NEXIUM) 20 MG capsule Take 20 mg by mouth daily as needed (acid reflux).   Past Month   acetaminophen (TYLENOL) 500 MG tablet Take 500 mg by mouth every 8 (eight) hours as needed for moderate pain.   10/24/2021   celecoxib (CELEBREX) 200 MG capsule Take 1 capsule (200 mg total) by mouth 2 (two) times daily. (Patient not taking: Reported on 11/04/2021) 90 capsule 0 Not Taking   enoxaparin (LOVENOX) 40 MG/0.4ML injection Inject 0.4 mLs (40 mg total) into the skin daily for 14 days. (Patient not taking: Reported on 11/04/2021) 5.6 mL 0 Not Taking   ibuprofen (ADVIL) 400 MG tablet Take 400 mg by mouth daily as needed. (Patient not taking: Reported on 11/04/2021)   Not Taking   naproxen sodium (ALEVE) 220 MG tablet Take 220 mg by mouth daily as needed. Alternate with ibuprofen every other day (Patient not taking: Reported on 11/04/2021)   Not Taking     Allergies  Allergen Reactions   Augmentin [Amoxicillin-Pot Clavulanate] Other (See Comments)    Upset stomach      Past Medical History:  Diagnosis Date   Arthritis    osteoarthritis   GERD (gastroesophageal  reflux disease)     Review of systems:  Otherwise negative.    Physical Exam  Gen: Alert, oriented. Appears stated age.  HEENT: PERRLA. Lungs: No respiratory distress CV: RRR Abd: soft, benign, no masses Ext: No edema    Planned procedures: Proceed with colonoscopy. The patient understands the nature of the planned procedure, indications, risks, alternatives and potential complications including but not limited to bleeding, infection, perforation, damage to internal organs and possible oversedation/side effects from anesthesia. The patient agrees and gives consent to proceed.  Please refer to procedure notes for findings, recommendations and patient disposition/instructions.     Regis Bill, MD [facility]enter[facility]

## 2021-11-04 NOTE — Anesthesia Preprocedure Evaluation (Signed)
Anesthesia Evaluation  Patient identified by MRN, date of birth, ID band Patient awake    Reviewed: Allergy & Precautions, NPO status , Patient's Chart, lab work & pertinent test results  History of Anesthesia Complications Negative for: history of anesthetic complications  Airway Mallampati: III  TM Distance: <3 FB Neck ROM: full    Dental  (+) Chipped   Pulmonary neg pulmonary ROS, neg shortness of breath,    Pulmonary exam normal        Cardiovascular Exercise Tolerance: Good (-) anginanegative cardio ROS Normal cardiovascular exam     Neuro/Psych negative neurological ROS  negative psych ROS   GI/Hepatic negative GI ROS, Neg liver ROS, GERD  Controlled,  Endo/Other  negative endocrine ROS  Renal/GU negative Renal ROS  negative genitourinary   Musculoskeletal  (+) Arthritis ,   Abdominal   Peds  Hematology negative hematology ROS (+)   Anesthesia Other Findings Past Medical History: No date: Arthritis     Comment:  osteoarthritis No date: GERD (gastroesophageal reflux disease)  Past Surgical History: No date: ABDOMINAL HYSTERECTOMY No date: APPENDECTOMY 2012: colonsopcopy No date: TONSILLECTOMY 04/25/2021: TOTAL HIP ARTHROPLASTY; Right     Comment:  Procedure: RIGHT TOTAL HIP ARTHROPLASTY;  Surgeon:               Donato Heinz, MD;  Location: [facility] ORS;  Service:               Orthopedics;  Laterality: Right; 07/07/2021: TOTAL HIP ARTHROPLASTY; Left     Comment:  Procedure: TOTAL HIP ARTHROPLASTY;  Surgeon: Donato Heinz, MD;  Location: [facility] ORS;  Service: Orthopedics;               Laterality: Left;  BMI    Body Mass Index: 29.76 kg/m      Reproductive/Obstetrics negative OB ROS                             Anesthesia Physical Anesthesia Plan  ASA: 2  Anesthesia Plan: General   Post-op Pain Management:    Induction: Intravenous  PONV Risk  Score and Plan: Propofol infusion and TIVA  Airway Management Planned: Natural Airway and Nasal Cannula  Additional Equipment:   Intra-op Plan:   Post-operative Plan:   Informed Consent: I have reviewed the patients History and Physical, chart, labs and discussed the procedure including the risks, benefits and alternatives for the proposed anesthesia with the patient or authorized representative who has indicated his/her understanding and acceptance.     Dental Advisory Given  Plan Discussed with: Anesthesiologist, CRNA and Surgeon  Anesthesia Plan Comments: (Patient consented for risks of anesthesia including but not limited to:  - adverse reactions to medications - risk of airway placement if required - damage to eyes, teeth, lips or other oral mucosa - nerve damage due to positioning  - sore throat or hoarseness - Damage to heart, brain, nerves, lungs, other parts of body or loss of life  Patient voiced understanding.)        Anesthesia Quick Evaluation

## 2021-11-04 NOTE — Anesthesia Procedure Notes (Signed)
Date/Time: 11/04/2021 8:01 AM Performed by: Junious Silk, CRNA Pre-anesthesia Checklist: Patient identified, Emergency Drugs available, Suction available, Patient being monitored and Timeout performed Oxygen Delivery Method: Nasal cannula

## 2021-11-05 ENCOUNTER — Encounter: Payer: Self-pay | Admitting: Gastroenterology

## 2022-04-25 IMAGING — MG MM DIGITAL SCREENING BILAT W/ TOMO AND CAD
8 series · 8 of 24 positions shown · non-contrast
Comparison: Previous exam(s).

ACR Breast Density Category a: The breast tissue is almost entirely
fatty.

CLINICAL DATA: Screening.

EXAM:
DIGITAL SCREENING BILATERAL MAMMOGRAM WITH TOMOSYNTHESIS AND CAD
TECHNIQUE: Bilateral screening digital craniocaudal and mediolateral oblique
mammograms were obtained. Bilateral screening digital breast
tomosynthesis was performed. The images were evaluated with
computer-aided detection.

[R CC synth-2D]
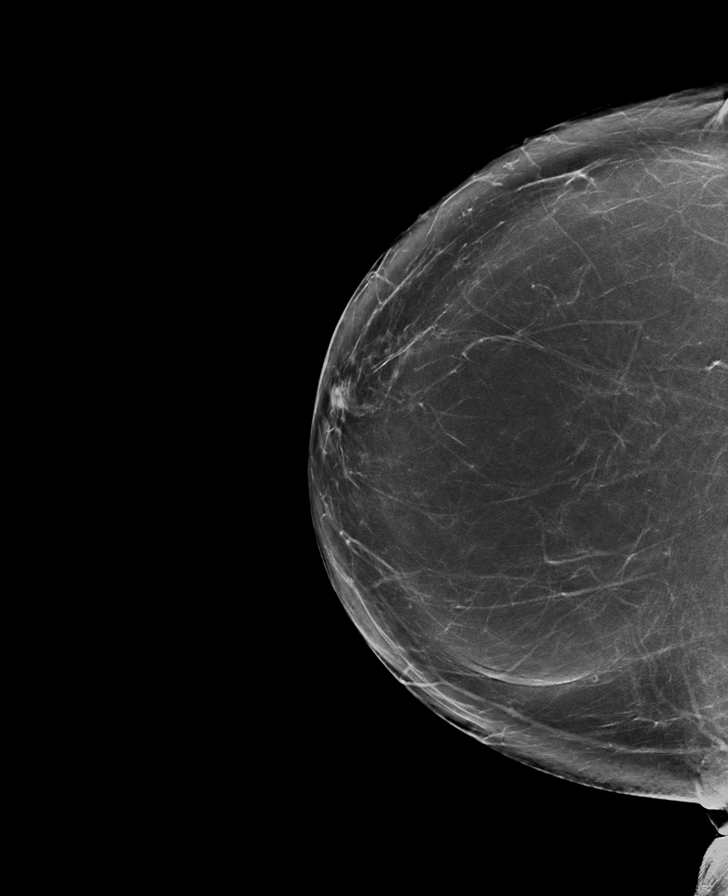

[R MLO synth-2D]
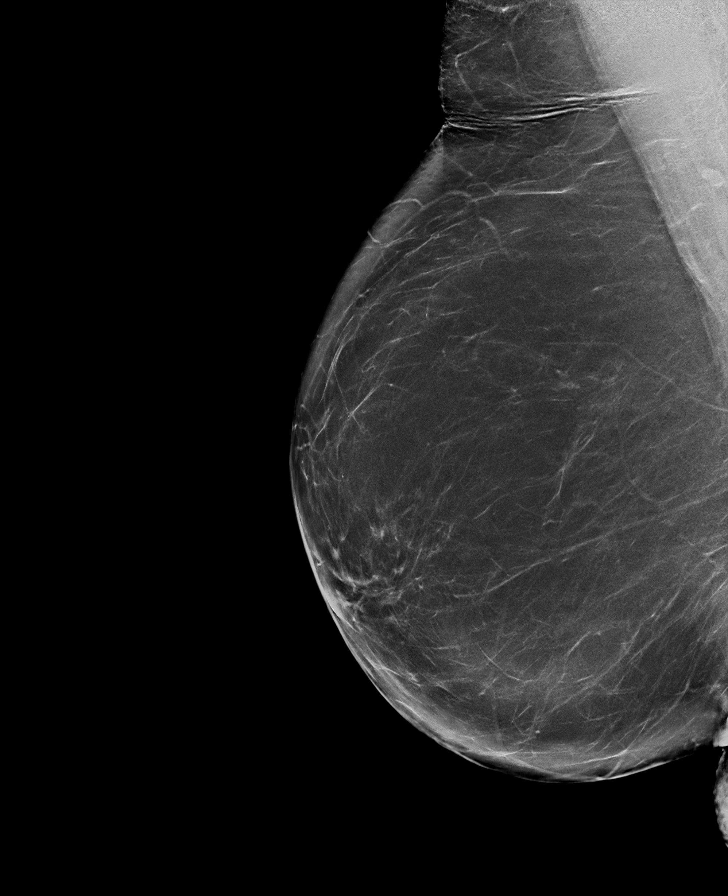

[L MLO synth-2D]
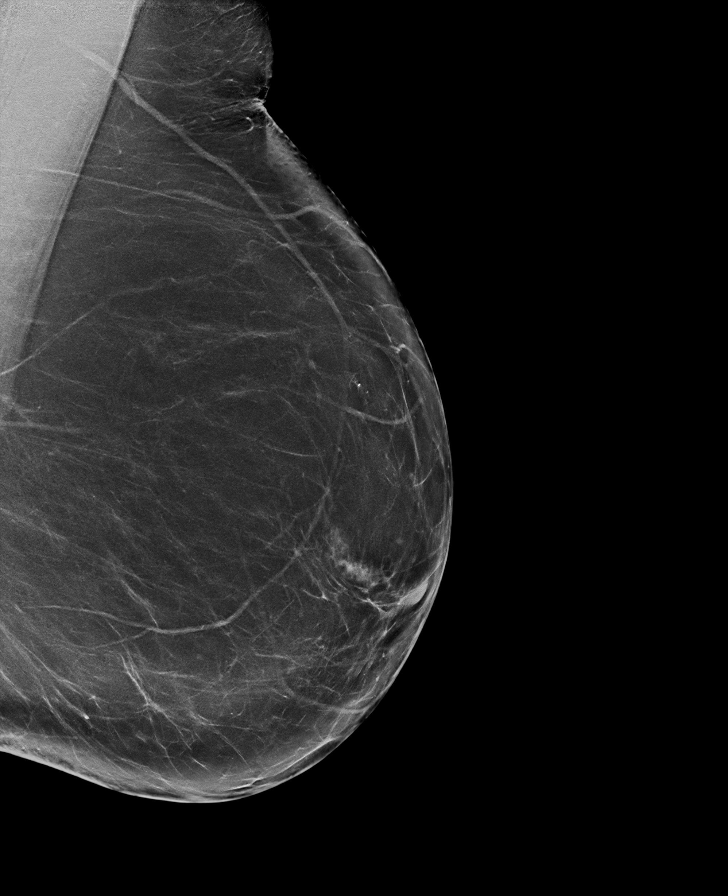

[L CC synth-2D]
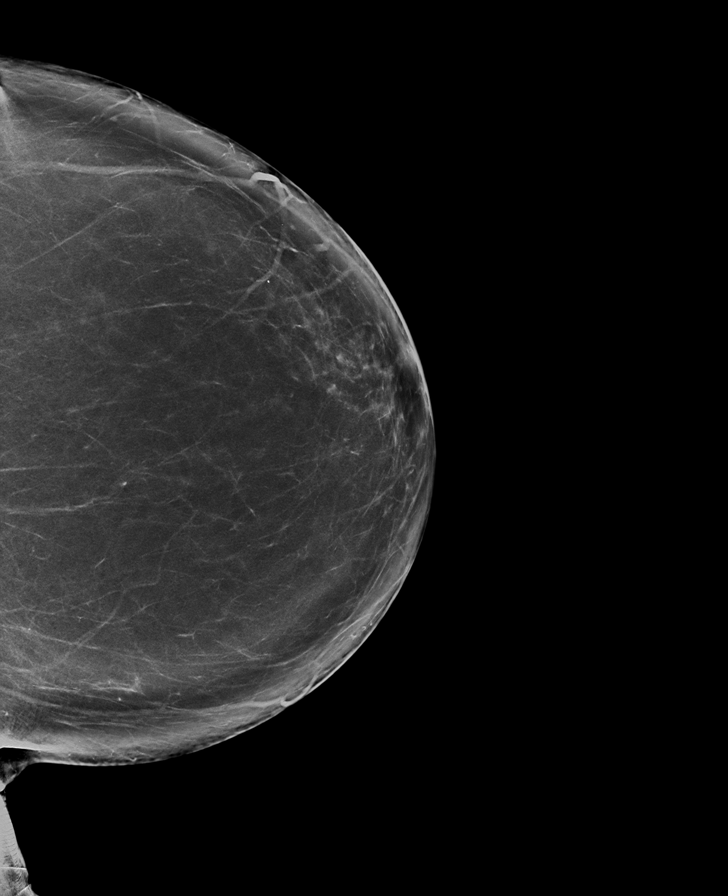

[L CC tomo · tomo slice 45/89.0]
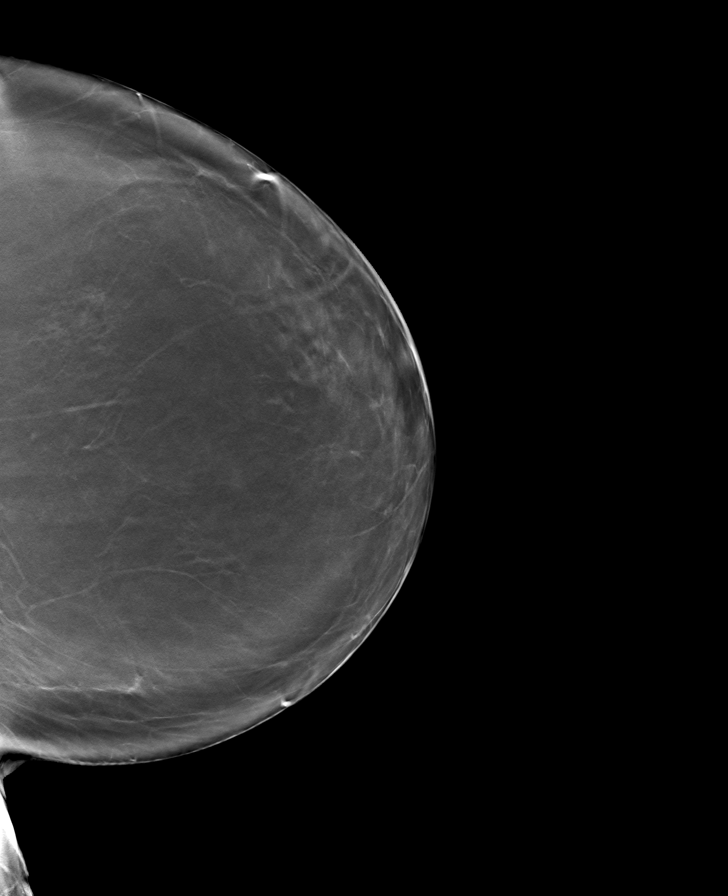

[R MLO tomo · tomo slice 46/91.0]
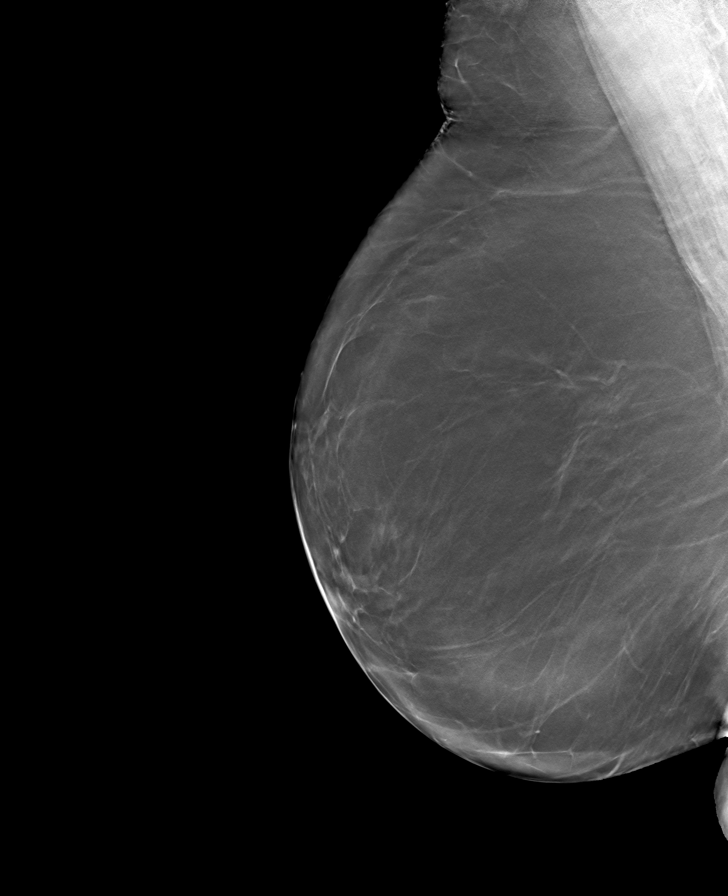

[L MLO tomo · tomo slice 45/90.0]
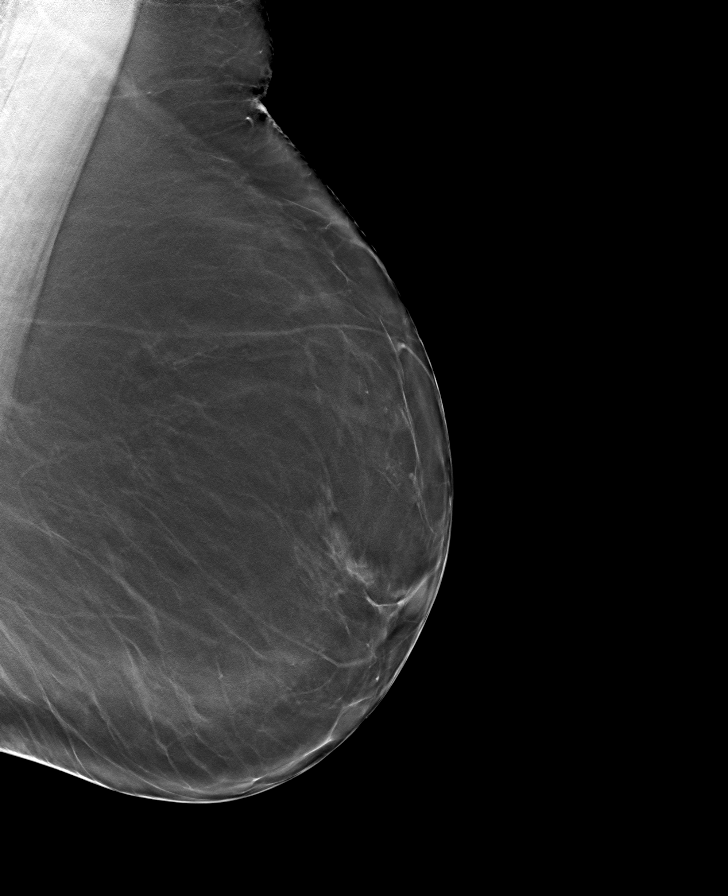

[R CC tomo · tomo slice 44/87.0]
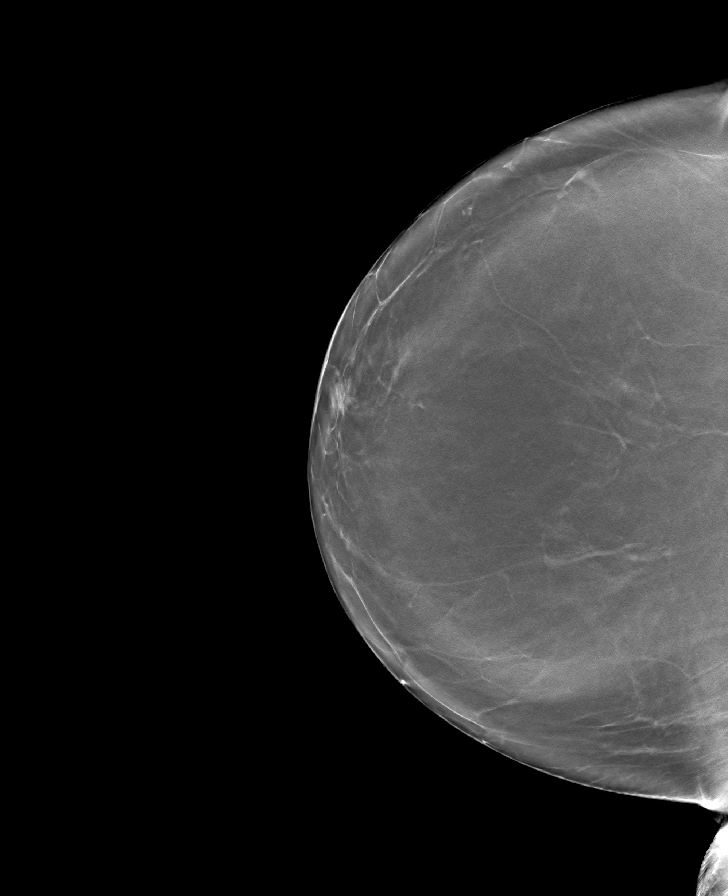

[8 of 24 positions shown; findings below may reference images not displayed]

FINDINGS: There are no findings suspicious for malignancy.
IMPRESSION: No mammographic evidence of malignancy. A result letter of this
screening mammogram will be mailed directly to the patient.

RECOMMENDATION:
Screening mammogram in one year. (Code:0E-3-N98)

BI-RADS CATEGORY  1: Negative.

## 2022-09-17 ENCOUNTER — Other Ambulatory Visit: Payer: Self-pay | Admitting: Family Medicine

## 2022-09-17 DIAGNOSIS — Z1231 Encounter for screening mammogram for malignant neoplasm of breast: Secondary | ICD-10-CM

## 2022-10-26 ENCOUNTER — Ambulatory Visit

## 2023-01-12 ENCOUNTER — Ambulatory Visit
Admission: RE | Admit: 2023-01-12 | Discharge: 2023-01-12 | Disposition: A | Discharge status: Home / Self Care | Payer: Medicare Other | Source: Ambulatory Visit | Attending: Family Medicine | Admitting: Family Medicine

## 2023-01-12 DIAGNOSIS — Z1231 Encounter for screening mammogram for malignant neoplasm of breast: Secondary | ICD-10-CM

## 2023-11-08 ENCOUNTER — Other Ambulatory Visit: Payer: Self-pay | Admitting: Family Medicine

## 2023-11-08 DIAGNOSIS — Z1231 Encounter for screening mammogram for malignant neoplasm of breast: Secondary | ICD-10-CM

## 2024-01-13 ENCOUNTER — Ambulatory Visit

## 2024-03-07 ENCOUNTER — Ambulatory Visit
Admission: RE | Admit: 2024-03-07 | Discharge: 2024-03-07 | Disposition: A | Discharge status: Home / Self Care | Source: Ambulatory Visit | Attending: Family Medicine | Admitting: Family Medicine

## 2024-03-07 DIAGNOSIS — Z1231 Encounter for screening mammogram for malignant neoplasm of breast: Secondary | ICD-10-CM | POA: Insufficient documentation
